# Patient Record
Sex: Female | Born: 1975 | Race: White | Hispanic: No | Marital: Single | Smoking: Never smoker
Health system: Southern US, Community
[De-identification: ages and names within clinical notes are randomized; demographics above are authoritative.]

## PROBLEM LIST (undated history)

## (undated) DIAGNOSIS — F4310 Post-traumatic stress disorder, unspecified: Secondary | ICD-10-CM

## (undated) DIAGNOSIS — F419 Anxiety disorder, unspecified: Secondary | ICD-10-CM

## (undated) DIAGNOSIS — F429 Obsessive-compulsive disorder, unspecified: Secondary | ICD-10-CM

## (undated) DIAGNOSIS — F32A Depression, unspecified: Secondary | ICD-10-CM

## (undated) DIAGNOSIS — F329 Major depressive disorder, single episode, unspecified: Secondary | ICD-10-CM

## (undated) HISTORY — DX: Depression, unspecified: F32.A

## (undated) HISTORY — DX: Post-traumatic stress disorder, unspecified: F43.10

## (undated) HISTORY — DX: Major depressive disorder, single episode, unspecified: F32.9

## (undated) HISTORY — DX: Anxiety disorder, unspecified: F41.9

## (undated) HISTORY — DX: Obsessive-compulsive disorder, unspecified: F42.9

---

## 2013-02-01 DIAGNOSIS — F429 Obsessive-compulsive disorder, unspecified: Secondary | ICD-10-CM | POA: Insufficient documentation

## 2013-02-01 DIAGNOSIS — N390 Urinary tract infection, site not specified: Secondary | ICD-10-CM | POA: Insufficient documentation

## 2013-05-10 DIAGNOSIS — F39 Unspecified mood [affective] disorder: Secondary | ICD-10-CM | POA: Insufficient documentation

## 2013-06-10 HISTORY — PX: CHOLECYSTECTOMY: SHX55

## 2015-11-24 ENCOUNTER — Telehealth: Payer: Self-pay | Admitting: Behavioral Health

## 2015-11-24 NOTE — Telephone Encounter (Signed)
Unable to reach patient at time of Pre-Visit Call.  Left message for patient to return call when available.    

## 2015-11-27 ENCOUNTER — Encounter: Payer: Self-pay | Admitting: Family

## 2015-11-27 ENCOUNTER — Ambulatory Visit (INDEPENDENT_AMBULATORY_CARE_PROVIDER_SITE_OTHER): Payer: BLUE CROSS/BLUE SHIELD | Admitting: Family

## 2015-11-27 VITALS — BP 138/88 | HR 73 | Temp 98.1°F | Resp 16 | Ht 70.5 in | Wt 306.0 lb

## 2015-11-27 DIAGNOSIS — M25572 Pain in left ankle and joints of left foot: Secondary | ICD-10-CM

## 2015-11-27 DIAGNOSIS — F429 Obsessive-compulsive disorder, unspecified: Secondary | ICD-10-CM | POA: Diagnosis not present

## 2015-11-27 DIAGNOSIS — F411 Generalized anxiety disorder: Secondary | ICD-10-CM | POA: Insufficient documentation

## 2015-11-27 DIAGNOSIS — R0683 Snoring: Secondary | ICD-10-CM

## 2015-11-27 DIAGNOSIS — F329 Major depressive disorder, single episode, unspecified: Secondary | ICD-10-CM

## 2015-11-27 DIAGNOSIS — F32A Depression, unspecified: Secondary | ICD-10-CM

## 2015-11-27 MED ORDER — ESCITALOPRAM OXALATE 10 MG PO TABS: ORAL_TABLET | ORAL | Status: DC

## 2015-11-27 NOTE — Assessment & Plan Note (Signed)
Uncontrolled. Should be improved with lexapro.

## 2015-11-27 NOTE — Progress Notes (Signed)
Subjective:    Patient ID: Rhonda Thompson, female    DOB: 09/27/1975, 40 y.o.   MRN: [id number]  HPI  Rhonda Thompson is a 40 yr old female who presents today to establish care.    Pmhx is significant for major depressive disorder, OCD and panic disorder.  She has been prescribed multiple medications in the past.  Review of psych note from [facility] 11/08/13 noted zoloft, xanax and abilify rx.  Dad died in 2002. Has nightmares.  Keeps xanax on hand.  Mom is ill.  Many years ago she was treated with paxil. If she forgot a dose or missed a day she had severe depression symptoms.  Zoloft helped her depression but did not help anxiety.  reports that she is very structured,  Feels like change stresses her out.  Needs to make sure that things are turned off.  Everything needs to face a certain direction.  More stressed at home.  "if I don't have stress I will make it." Lately she is having panic attacks 2-3 times a day. She denies depression.    Elevated blood pressure- reports that she has been on hctz in the past mostly due to LE swelling (sits at work).    She has hx of remote ankle fracture 15 years ago.  Has some chronic pain in the left foot.    Daytime somnolence, witnessed apneas.  Reports some tongue biting, hits head in sleep. Reports that her insurance would not cover sleep study. Can't take time off for EEG, denies witnessed seizures or incontinence.   + spider veins/swelling lower legs, area of spider veins is tender.  Review of Systems    see HPI  Past Medical History  Diagnosis Date  . PTSD (post-traumatic stress disorder)   . OCD (obsessive compulsive disorder)   . Depression   . Anxiety     has panic attacks     Social History   Social History  . Marital Status: Single    Spouse Name: N/A  . Number of Children: N/A  . Years of Education: N/A   Occupational History  . Not on file.   Social History Main Topics  . Smoking status: Never Smoker   . Smokeless tobacco: Not on  file  . Alcohol Use: No  . Drug Use: Not on file  . Sexual Activity: Not on file   Other Topics Concern  . Not on file   Social History Narrative   Lives with her mom   Lost a child 5-6   Raising her niece since she was born (she is age 38)   Works as a Architectural technologistrehab director for [employer]   2 pit bull rescues    Past Surgical History  Procedure Laterality Date  . Cholecystectomy  06/10/13    Family History  Problem Relation Age of Onset  . Hypertension Mother   . Diabetes Mother   . Cancer Mother     ovarian and kidney  . Depression Mother     Allergies  Allergen Reactions  . Caffeine Anxiety and Other (See Comments)  . Iodinated Diagnostic Agents Swelling    Hives and itching  . Other Rash    Bandaids  . Red Dye Other (See Comments)    headache  . Nitrofurantoin Other (See Comments)    headache    No current outpatient prescriptions on file prior to visit.   No current facility-administered medications on file prior to visit.    BP 138/88 mmHg  Pulse 73  Temp(Src) 98.1 F (36.7 C) (Oral)  Resp 16  Ht 5' 10.5" (1.791 m)  Wt 306 lb (138.801 kg)  BMI 43.27 kg/m2  SpO2 100%  LMP 11/09/2015    Objective:   Physical Exam  Constitutional: She is oriented to person, place, and time. She appears well-developed and well-nourished.  Cardiovascular: Normal rate, regular rhythm and normal heart sounds.   No murmur heard. Pulmonary/Chest: Effort normal and breath sounds normal. No respiratory distress. She has no wheezes.  Musculoskeletal:  1-2+ bilateral LE edema (left ankle is slightly more swollen than the right ankle).   Neurological: She is alert and oriented to person, place, and time.  Skin:  + spider veins noted bilateral lower shins L>R  Psychiatric: Her behavior is normal. Judgment and thought content normal.  Briefly tearful during the exam when she discussed her father's death          Assessment & Plan:  L ankle pain- refer to orthopedics  for further evaluation.   Snoring/daytime somnolence, witnessed apneas- suspect OSA. Will send for a home sleep study.  I am concerned about her report of tongue biting at night and head injuries.  We discussed possibility of nighttime seizures. She tells me that her last doctor wanted her to perform EEG overnight but she could not take the time off of work. She is not willing to do at this time but I told her that I think that it would be a good idea as soon as she is able and advised her to let me know and I will arrange when she is ready.  Venous insufficiency- trial of compression stockings, rx provided for the patient.   45 minutes spent with pt today. >50% of this time was spent counseling patient on depression/anxiety, OSA work up and venous insufficiency.

## 2015-11-27 NOTE — Patient Instructions (Signed)
Begin lexapro 10mg , 1/2 tab once daily for 1 week, then increase to a full tablet once daily on week two. You will be contacted about your referral for home sleep test and orthopedics for your ankle pain. Please see if you can get a custom measured support hose.

## 2015-11-27 NOTE — Assessment & Plan Note (Signed)
Uncontrolled.  Will begin lexapro.  I instructed pt to start 1/2 tablet once daily for 1 week and then increase to a full tablet once daily on week two as tolerated. Discussed rare but serious side effect of suicide ideation.  She is instructed to discontinue medication go directly to ED if this occurs.  Pt verbalizes understanding.  Plan follow up in 1 month to evaluate progress.

## 2015-11-27 NOTE — Assessment & Plan Note (Addendum)
Appears stable. Monitor

## 2015-11-27 NOTE — Progress Notes (Signed)
Pre visit review using our clinic review tool, if applicable. No additional management support is needed unless otherwise documented below in the visit note. 

## 2015-12-07 DIAGNOSIS — G4733 Obstructive sleep apnea (adult) (pediatric): Secondary | ICD-10-CM | POA: Diagnosis not present

## 2015-12-13 ENCOUNTER — Other Ambulatory Visit: Payer: Self-pay | Admitting: *Deleted

## 2015-12-13 DIAGNOSIS — R0683 Snoring: Secondary | ICD-10-CM

## 2015-12-13 DIAGNOSIS — G4733 Obstructive sleep apnea (adult) (pediatric): Secondary | ICD-10-CM

## 2015-12-14 ENCOUNTER — Telehealth: Payer: Self-pay | Admitting: Family

## 2015-12-14 DIAGNOSIS — G4730 Sleep apnea, unspecified: Secondary | ICD-10-CM

## 2015-12-14 NOTE — Telephone Encounter (Signed)
Patient returning your call.

## 2015-12-14 NOTE — Telephone Encounter (Signed)
LMOM informing Pt to return call.  

## 2015-12-14 NOTE — Telephone Encounter (Signed)
Please advise pt that I reviewed her home sleep study and it shows severe sleep apnea.  I recommend a sleep study in the lab so we can get her cpap settings optimized. Will arrange.

## 2015-12-15 NOTE — Telephone Encounter (Signed)
Spoke w/ Pt, informed her of home sleep study results and informed her that Melissa recommends being seen by lab for cpap settings. Pt worried about cost. Informed her that our PCC's will contact insurance for approval and when lab calls to schedule they should be able to put her in contact w/ someone w/ there about costs. Pt verbalized understanding.

## 2015-12-18 ENCOUNTER — Telehealth: Payer: Self-pay | Admitting: Family

## 2015-12-18 DIAGNOSIS — G4733 Obstructive sleep apnea (adult) (pediatric): Secondary | ICD-10-CM

## 2015-12-18 NOTE — Telephone Encounter (Signed)
°  Relationship to patient: Self Can be reached: [phone]   Pharmacy:  Reason for call: Patient called stating that she was informed by her insurance company that she has to pay $1500 for the Sleep Study. Patient states she will not be having it done. Also wants to discuss headaches that she is having daily. Plse adv

## 2015-12-18 NOTE — Telephone Encounter (Signed)
Pt returned call. She is requesting a call back. She says if unavailable please leave a detailed voicemail for her.

## 2015-12-18 NOTE — Telephone Encounter (Signed)
[facility] @ [phone]) asking the pt to RTC regarding the message below.//AB/CMA

## 2015-12-19 NOTE — Addendum Note (Signed)
Addended by: Sandford Craze'SULLIVAN, Hazel Wrinkle on: 12/19/2015 04:22 PM   Modules accepted: Orders

## 2015-12-19 NOTE — Telephone Encounter (Signed)
Pt called back and she stated that she has already had the home sleep study, which was on (12/05/15).  She said she has been under a lot of stress at work and with her mother, and the headaches have been getting worse.  She's taking Advil twice a day everyday.  Please advise.//AB/CMA

## 2015-12-19 NOTE — Telephone Encounter (Signed)
Called and left detail VM @ 9:17am @ [phone]) informing the pt-per Melissa that we can submit a home sleep study if she would like to do that.  Also informed her that the headaches could be coming from the sleep apnea.//AB/CMA

## 2015-12-19 NOTE — Telephone Encounter (Signed)
I will send order for auto CPAP. It helps to treat her sleep apnea, however it is not as good as having the setting titrated in a lab. But better than no cpap. Headaches likely related to her untreated sleep apnea.  I would like to see her back in the office 1 month after beginning CPAP.

## 2015-12-20 NOTE — Telephone Encounter (Signed)
Called and spoke with the pt and informed her of the note below.  Pt verbalized understanding and agreed.  Informed her that the order has been placed and she should receive a call from Home Health.  Pt agreed.//AB/CMA

## 2015-12-20 NOTE — Telephone Encounter (Signed)
Did you speak with her? Thanks.

## 2015-12-24 ENCOUNTER — Other Ambulatory Visit: Payer: Self-pay | Admitting: Family

## 2015-12-25 NOTE — Telephone Encounter (Signed)
Spoke with the pt regarding the refill request for the Lexapro.  Pt stated that she has not started the medication yet.  Refill request denied.//AB/CMA

## 2015-12-25 NOTE — Telephone Encounter (Signed)
[facility] @ 11:35am @ [phone]) asking the pt to RTC regarding medication refill request.//AB/CMA

## 2015-12-26 ENCOUNTER — Telehealth: Payer: Self-pay | Admitting: *Deleted

## 2015-12-26 NOTE — Telephone Encounter (Signed)
[facility] from [facility] for order: CPAP w/humidifier, face mask and chinstrap. Order forwarded to PCP for signature.

## 2015-12-27 NOTE — Telephone Encounter (Signed)
Signed and placed in Jessica's Folder.

## 2015-12-28 ENCOUNTER — Ambulatory Visit: Payer: BLUE CROSS/BLUE SHIELD | Admitting: Family

## 2016-01-03 ENCOUNTER — Encounter: Payer: Self-pay | Admitting: Family

## 2016-01-03 ENCOUNTER — Telehealth: Payer: Self-pay | Admitting: *Deleted

## 2016-01-03 NOTE — Telephone Encounter (Signed)
Pt called stating CPAP supplies are too expensive even with insurance approval ($400 +). Her employer offers assistance with the cost if we will send address a letter of medical necessity to [employer]. Pt will print from mychart when letter is ready.  Please advise.

## 2016-11-15 ENCOUNTER — Ambulatory Visit (INDEPENDENT_AMBULATORY_CARE_PROVIDER_SITE_OTHER): Payer: BLUE CROSS/BLUE SHIELD | Admitting: Family

## 2016-11-15 ENCOUNTER — Encounter: Payer: Self-pay | Admitting: Family

## 2016-11-15 VITALS — BP 140/92 | HR 76 | Temp 98.2°F | Resp 20 | Ht 71.0 in | Wt 305.8 lb

## 2016-11-15 DIAGNOSIS — F418 Other specified anxiety disorders: Secondary | ICD-10-CM

## 2016-11-15 DIAGNOSIS — R5383 Other fatigue: Secondary | ICD-10-CM | POA: Diagnosis not present

## 2016-11-15 DIAGNOSIS — G4733 Obstructive sleep apnea (adult) (pediatric): Secondary | ICD-10-CM | POA: Diagnosis not present

## 2016-11-15 LAB — COMPREHENSIVE METABOLIC PANEL
ALT: 13 U/L (ref 0–35)
AST: 15 U/L (ref 0–37)
Albumin: 4.2 g/dL (ref 3.5–5.2)
Alkaline Phosphatase: 45 U/L (ref 39–117)
BUN: 12 mg/dL (ref 6–23)
CHLORIDE: 105 meq/L (ref 96–112)
CO2: 25 meq/L (ref 19–32)
CREATININE: 0.95 mg/dL (ref 0.40–1.20)
Calcium: 9.4 mg/dL (ref 8.4–10.5)
GFR: 69.49 mL/min (ref >60.00)
Glucose, Bld: 92 mg/dL (ref 70–99)
Potassium: 4.5 mEq/L (ref 3.5–5.1)
SODIUM: 137 meq/L (ref 135–145)
Total Bilirubin: 0.5 mg/dL (ref 0.2–1.2)
Total Protein: 7.5 g/dL (ref 6.0–8.3)

## 2016-11-15 LAB — CBC WITH DIFFERENTIAL/PLATELET
BASOS PCT: 0.9 % (ref 0.0–3.0)
Basophils Absolute: 0.1 10*3/uL (ref 0.0–0.1)
Eosinophils Absolute: 0.1 10*3/uL (ref 0.0–0.7)
Eosinophils Relative: 1.4 % (ref 0.0–5.0)
HEMATOCRIT: 39.6 % (ref 36.0–46.0)
Hemoglobin: 12.8 g/dL (ref 12.0–15.0)
LYMPHS PCT: 22.9 % (ref 12.0–46.0)
Lymphs Abs: 1.9 10*3/uL (ref 0.7–4.0)
MCHC: 32.3 g/dL (ref 30.0–36.0)
MCV: 82.3 fl (ref 78.0–100.0)
Monocytes Absolute: 0.6 10*3/uL (ref 0.1–1.0)
Monocytes Relative: 7.5 % (ref 3.0–12.0)
NEUTROS ABS: 5.5 10*3/uL (ref 1.4–7.7)
Neutrophils Relative %: 67.3 % (ref 43.0–77.0)
PLATELETS: 307 10*3/uL (ref 150.0–400.0)
RBC: 4.81 Mil/uL (ref 3.87–5.11)
RDW: 15 % (ref 11.5–15.5)
WBC: 8.2 10*3/uL (ref 4.0–10.5)

## 2016-11-15 LAB — TSH: TSH: 2.39 u[IU]/mL (ref 0.35–4.50)

## 2016-11-15 MED ORDER — ESCITALOPRAM OXALATE 10 MG PO TABS: ORAL_TABLET | ORAL | 0 refills | Status: DC

## 2016-11-15 NOTE — Patient Instructions (Signed)
Complete lab work prior to leaving.  Please establish with a counselor. Begin lexapro 1/2 tab once daily for 1 week, then increase to a full tab once daily on week two. Work on [employer] diet, exercise and weight loss.

## 2016-11-15 NOTE — Progress Notes (Signed)
Subjective:    Patient ID: Rhonda Thompson, female    DOB: 04/12/1976, 41 y.o.   MRN: [id number]  HPI  Rhonda Thompson is a 41 yr old female who presents today to discuss fatigue.    Of note, her mother passed away in September. We noted increased anxiety/depression and OCD symptoms back on 11/27/15 and she was given a trial of lexapro. She did not follow back up with us for re-evaluation.  She states that her life just got crazy with her mother's illness and she never started the medication.   She had a home sleep study performed 12/07/15 which noted severe sleep apnea. We referred her for a split night study which was not completed.  Notes + exhaustion and occasional dizziness. She says that someone called her and told her how expensive the cpap would be and she could not afford it so never followed through.  She has been seeing ENT for some left ear pain.    Review of Systems    see HPI  Past Medical History:  Diagnosis Date  . Anxiety    has panic attacks  . Depression   . OCD (obsessive compulsive disorder)   . PTSD (post-traumatic stress disorder)      Social History   Social History  . Marital status: Single    Spouse name: N/A  . Number of children: N/A  . Years of education: N/A   Occupational History  . Not on file.   Social History Main Topics  . Smoking status: Never Smoker  . Smokeless tobacco: Never Used  . Alcohol use No  . Drug use: Unknown  . Sexual activity: Not on file   Other Topics Concern  . 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 CHOLECYSTECTOMY  06/10/13    Family History  Problem Relation Age of Onset  . Hypertension Mother   . Diabetes Mother   . Cancer Mother        ovarian and kidney  . Depression Mother      Allergies  Allergen Reactions  . Caffeine Anxiety and Other (See Comments)  . Iodinated Diagnostic Agents Swelling    Hives and itching  . Other Rash    Bandaids  . Red Dye Other (See Comments)    headache  . Nitrofurantoin Other (See Comments)    headache    Current Outpatient Prescriptions on File Prior to Visit  Medication Sig Dispense Refill  . ALPRAZolam (XANAX XR) 0.5 MG 24 hr tablet Take 0.5 mg by mouth 4 (four) times daily. Reported on 11/27/2015    . escitalopram (LEXAPRO) 10 MG tablet 1/2 tab once daily for 1 week, then increase to a full tab once daily (Patient not taking: Reported on 11/15/2016) 30 tablet 0   No current facility-administered medications on file prior to visit.     BP (!) 140/92 (BP Location: Left Arm, Cuff Size: Large)   Pulse 76   Temp 98.2 F (36.8 C) (Oral)   Resp 20   Ht 5\' 11"  (1.803 m)   Wt (!) 305 lb 12.8 oz (138.7 kg)   LMP 10/18/2016   SpO2 100%   BMI 42.65 kg/m  Objective:   Physical Exam  Constitutional: She appears well-developed and well-nourished.  Cardiovascular: Normal rate, regular rhythm and normal heart sounds.   No murmur heard. Pulmonary/Chest: Effort normal and breath sounds normal. No respiratory distress. She has no wheezes.  Psychiatric: Her behavior is normal. Judgment and thought content normal.  tearful          Assessment & Plan:  Depression/anxiety- uncontrolled. Denies current SI/HI but reports a feeling of hopelessness. "what is the point." Notes she would never hurt herself because she would never want to put her daughter through that. She agrees to call 911 if she has thoughts of hurting herself or others.  She is agreeable to counseling- I gave her information to call and schedule an appointment with a  Counselor.  OSA- Severe- I think this is one of her primary issues and is impacting her overall wellness and mood. I will try to arrange a trial of autotitration CPAP if she is able to afford. We  did discuss the importance of weight loss.   Fatigue- check TSH, CMET, CBC.  25 minutes spent with pt. >50% of this time was spent counseling patient on anxiety/depression and importance of OSA treatment.

## 2016-11-16 ENCOUNTER — Encounter: Payer: Self-pay | Admitting: Family

## 2016-11-21 ENCOUNTER — Encounter: Payer: Self-pay | Admitting: Family

## 2016-12-16 ENCOUNTER — Ambulatory Visit: Payer: BLUE CROSS/BLUE SHIELD | Admitting: Family

## 2017-03-05 ENCOUNTER — Telehealth: Payer: Self-pay | Admitting: Family

## 2017-03-05 NOTE — Telephone Encounter (Signed)
Relation to WU:JWJX Call back number: [phone]   Reason for call:  Patient dropped of [facility] Physician Result Release Letter, patient states she was advised 03/04/17 by employer and form is due on 03/09/17, patient requesting form please fax to Physician Health Screening form FAX # [phone]   *informed patient paperwork 5 to 7 business day turnaround handed form to [facility]

## 2017-03-05 NOTE — Telephone Encounter (Signed)
Patient advised physical is needed to complete form due to labs being needed as per Jasmine December. Patient informed and states please discard she will go to a facility were labs will be conducted and immediate release of results.

## 2017-05-23 DIAGNOSIS — H81399 Other peripheral vertigo, unspecified ear: Secondary | ICD-10-CM | POA: Diagnosis not present

## 2017-05-23 DIAGNOSIS — H539 Unspecified visual disturbance: Secondary | ICD-10-CM | POA: Diagnosis not present

## 2017-05-23 DIAGNOSIS — R42 Dizziness and giddiness: Secondary | ICD-10-CM | POA: Diagnosis not present

## 2017-05-23 DIAGNOSIS — Z881 Allergy status to other antibiotic agents status: Secondary | ICD-10-CM | POA: Diagnosis not present

## 2017-05-23 DIAGNOSIS — H81393 Other peripheral vertigo, bilateral: Secondary | ICD-10-CM | POA: Diagnosis not present

## 2017-06-20 ENCOUNTER — Ambulatory Visit: Payer: BLUE CROSS/BLUE SHIELD | Admitting: Family

## 2017-06-20 VITALS — BP 144/94 | HR 68 | Temp 98.6°F | Resp 16 | Ht 71.0 in | Wt 306.0 lb

## 2017-06-20 DIAGNOSIS — R002 Palpitations: Secondary | ICD-10-CM | POA: Diagnosis not present

## 2017-06-20 DIAGNOSIS — R42 Dizziness and giddiness: Secondary | ICD-10-CM

## 2017-06-20 DIAGNOSIS — F418 Other specified anxiety disorders: Secondary | ICD-10-CM | POA: Diagnosis not present

## 2017-06-20 DIAGNOSIS — G4733 Obstructive sleep apnea (adult) (pediatric): Secondary | ICD-10-CM

## 2017-06-20 LAB — BASIC METABOLIC PANEL
BUN: 10 mg/dL (ref 6–23)
CALCIUM: 9.4 mg/dL (ref 8.4–10.5)
CO2: 28 meq/L (ref 19–32)
Chloride: 103 mEq/L (ref 96–112)
Creatinine, Ser: 0.87 mg/dL (ref 0.40–1.20)
GFR: 76.68 mL/min (ref >60.00)
GLUCOSE: 93 mg/dL (ref 70–99)
POTASSIUM: 4.5 meq/L (ref 3.5–5.1)
SODIUM: 139 meq/L (ref 135–145)

## 2017-06-20 LAB — CBC WITH DIFFERENTIAL/PLATELET
BASOS ABS: 0.1 10*3/uL (ref 0.0–0.1)
Basophils Relative: 0.9 % (ref 0.0–3.0)
EOS PCT: 2.6 % (ref 0.0–5.0)
Eosinophils Absolute: 0.2 10*3/uL (ref 0.0–0.7)
HEMATOCRIT: 38.5 % (ref 36.0–46.0)
Hemoglobin: 12.5 g/dL (ref 12.0–15.0)
LYMPHS ABS: 1.7 10*3/uL (ref 0.7–4.0)
LYMPHS PCT: 24 % (ref 12.0–46.0)
MCHC: 32.4 g/dL (ref 30.0–36.0)
MCV: 82.6 fl (ref 78.0–100.0)
MONOS PCT: 6.8 % (ref 3.0–12.0)
Monocytes Absolute: 0.5 10*3/uL (ref 0.1–1.0)
NEUTROS PCT: 65.7 % (ref 43.0–77.0)
Neutro Abs: 4.6 10*3/uL (ref 1.4–7.7)
Platelets: 281 10*3/uL (ref 150.0–400.0)
RBC: 4.66 Mil/uL (ref 3.87–5.11)
RDW: 15.3 % (ref 11.5–15.5)
WBC: 7 10*3/uL (ref 4.0–10.5)

## 2017-06-20 LAB — TSH: TSH: 2.92 u[IU]/mL (ref 0.35–4.50)

## 2017-06-20 NOTE — Progress Notes (Signed)
Subjective:    Patient ID: Rhonda Thompson, female    DOB: 09/18/1975, 42 y.o.   MRN: [id number]  HPI  Pt is a 42 yr old female who presents today for follow up.  1) Patient is a 42 yr old female who presents today with report of intermittent dizziness. Reports that dizziness has been present since last year.   ED note reviewed from 12/14 in care everywhere.  She was diagnosed with peripheral vertigo and was prescribed valium, meclizine, prednisone. She reports that she did not take the medication due to concern about side effects.    2) Palpitations- have been present x 2 weeks.  Reports that palpitations are worse at night and in the AM.    3) Depression/anxiety- last visit symptoms were uncontrolled and we recommended counseling. We aslso recommended a trial of lexapro back in June but have not seen her back until now. Reports that she is seeing psychiatry through her work.   They stopped lexapro and put her on zoloft and an extended release xanax.  Reports that she has a lot of tension in neck/shoulders.   Wt Readings from Last 3 Encounters:  06/20/17 (!) 306 lb (138.8 kg)  11/15/16 (!) 305 lb 12.8 oz (138.7 kg)  11/27/15 (!) 306 lb (138.8 kg)   4) OSA- had a home sleep study 6/17 which noted severe osa.  Reports that she wakes up a "million times" and has bad nightmare. Reports that her insurance will not cover any of the cpap.  Out of    Review of Systems    see HPI  Past Medical History:  Diagnosis Date  . Anxiety    has panic attacks  . Depression   . OCD (obsessive compulsive disorder)   . PTSD (post-traumatic stress disorder)      Social History   Socioeconomic History  . Marital status: Single    Spouse name: Not on file  . Number of children: Not on file  . Years of education: Not on file  . Highest education level: Not on file  Social Needs  . Financial resource strain: Not on file  . Food insecurity - worry: Not on file  . Food insecurity - inability: Not on  file  . Transportation needs - medical: Not on file  . Transportation needs - non-medical: Not on file  Occupational History  . Not on file  Tobacco Use  . Smoking status: Never Smoker  . Smokeless tobacco: Never Used  Substance and Sexual Activity  . Alcohol use: No    Alcohol/week: 0.0 oz  . Drug use: Not on file  . Sexual activity: Not on file  Other Topics Concern  . 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 CHOLECYSTECTOMY  06/10/13    Family History  Problem Relation Age of Onset  . Hypertension Mother   . Diabetes Mother   . Cancer Mother        ovarian and kidney  . Depression Mother     Allergies  Allergen Reactions  . Caffeine Anxiety and Other (See Comments)  . Iodinated Diagnostic Agents Swelling    Hives and itching  . Other Rash    Bandaids  .  Red Dye Other (See Comments)    headache  . Nitrofurantoin Other (See Comments)    headache    Current Outpatient Medications on File Prior to Visit  Medication Sig Dispense Refill  . sertraline (ZOLOFT) 50 MG tablet Take 1 tablet by mouth daily.    Marland Kitchen. ALPRAZolam (XANAX XR) 0.5 MG 24 hr tablet Take 1 tablet by mouth daily as needed.     No current facility-administered medications on file prior to visit.     BP (!) 136/98 (BP Location: Right Arm) Comment (Cuff Size): thigh cuff  Pulse 68   Temp 98.6 F (37 C) (Oral)   Resp 16   Ht 5\' 11"  (1.803 m)   Wt (!) 306 lb (138.8 kg)   SpO2 100%   BMI 42.68 kg/m    Objective:   Physical Exam  Constitutional: She is oriented to person, place, and time. She appears well-developed and well-nourished.  Cardiovascular: Normal rate, regular rhythm and normal heart sounds.  No murmur heard. Pulmonary/Chest: Effort normal and breath sounds normal. No  respiratory distress. She has no wheezes.  Neurological: She is alert and oriented to person, place, and time.  Psychiatric: Her behavior is normal. Judgment and thought content normal.  tearful          Assessment & Plan:  BPV- refer for vestibular rehab. BP Readings from Last 3 Encounters:  06/20/17 (!) 136/98  11/15/16 (!) 140/92  11/27/15 138/88   OSA- severe/untreated- I think this is her primary medical issue and contributing to her other symptoms. Insurance will not cover cpap.  Will refer her to orthodontics for evaluation for dental appliance. Discussed healthy diet, exercise and weight loss.  Anxiety/depression- now following with psychiatry- this remains uncontrolled. Advised her to follow up with psychiatry.  Palpitations- likely related to her anxiety- check bmet, cbc, tsh- EKG today.  EKG tracing is personally reviewed.  EKG notes NSR.  No acute changes.

## 2017-06-20 NOTE — Progress Notes (Signed)
Repeat BP 144/94. Pt's machine was consistent with our reading in the office. Other readings from pt's machine have been well within the normal range. Pt feels like she gets anxious when she is at the doctor. Per verbal from PCP, pt has been advised to check BP over the next week and send us the readings. Pt voices understanding.

## 2017-06-20 NOTE — Addendum Note (Signed)
Addended by: Mervin KungFERGERSON, Kayline Sheer A on: 06/20/2017 01:57 PM   Modules accepted: Orders

## 2017-06-20 NOTE — Patient Instructions (Addendum)
Please complete lab work prior to leaving.   You will be contacted about your referral to Dr. Myrtis SerKatz (orthodontics) for dental appliance consideration. You will also be contacted about your referral to vestibular rehab for you vertigo. Please follow up with psychiatry for ongoing treatment of your anxiety. Continue to work on health diet exercise and weight loss.

## 2017-06-25 DIAGNOSIS — F330 Major depressive disorder, recurrent, mild: Secondary | ICD-10-CM | POA: Diagnosis not present

## 2017-06-27 ENCOUNTER — Telehealth: Payer: Self-pay | Admitting: *Deleted

## 2017-06-27 NOTE — Telephone Encounter (Signed)
Received Physician Orders from Sylvan CheeseMark J. Katz, DDS; forwarded to provider/SLS 01/18

## 2017-07-04 ENCOUNTER — Ambulatory Visit: Payer: BLUE CROSS/BLUE SHIELD | Admitting: Rehabilitative and Restorative Service Providers"

## 2017-07-07 ENCOUNTER — Telehealth: Payer: Self-pay | Admitting: *Deleted

## 2017-07-07 NOTE — Telephone Encounter (Signed)
Copied from CRM [phone]. Topic: Inquiry >> Jul 07, 2017 11:03 AM Yvonna Alanisobinson, Andra M wrote: Reason for CRM: Patient called stating that the company that Sandford CrazeMelissa O'Sullivan referred the patient to, is requiring a copy of the patient's sleep study from 2 years ago. Patient wants Efraim KaufmannMelissa to call [phone].        Thank You!!!

## 2017-07-08 NOTE — Telephone Encounter (Signed)
Left message for pt to check mychart acct. Message sent. 

## 2017-07-09 ENCOUNTER — Ambulatory Visit: Payer: BLUE CROSS/BLUE SHIELD | Admitting: Physical Therapy

## 2017-07-14 ENCOUNTER — Encounter: Payer: Self-pay | Admitting: Physical Therapy

## 2017-07-14 ENCOUNTER — Ambulatory Visit: Payer: BLUE CROSS/BLUE SHIELD | Attending: Family | Admitting: Physical Therapy

## 2017-07-14 DIAGNOSIS — R252 Cramp and spasm: Secondary | ICD-10-CM | POA: Diagnosis not present

## 2017-07-14 DIAGNOSIS — R2681 Unsteadiness on feet: Secondary | ICD-10-CM | POA: Diagnosis not present

## 2017-07-14 DIAGNOSIS — R42 Dizziness and giddiness: Secondary | ICD-10-CM

## 2017-07-14 NOTE — Patient Instructions (Signed)
Feet Together (Compliant Surface) Head Motion - Eyes Closed    Stand on compliant surface: _pillow or cushion__ with feet together. Close eyes and move head slowly, up and down 10 times each way and side to side 10 times each way. Repeat __1-2__ times per session. Do __2-3__ sessions per day.  Gaze Stabilization: Standing Feet Apart    Feet shoulder width apart, keeping eyes on target on wall __3-5__ feet away, move head side to side for __30__ seconds. Repeat while moving head up and down for __30__ seconds. Do _2-3___ sessions per day.   Gaze Stabilization: Tip Card  1.Target must remain in focus, not blurry, and appear stationary while head is in motion. 2.Perform exercises with small head movements (45 to either side of midline). 3.Increase speed of head motion so long as target is in focus. 4.If you wear eyeglasses, be sure you can see target through lens (therapist will give specific instructions for bifocal / progressive lenses). 5.These exercises may provoke dizziness or nausea. Work through these symptoms. If too dizzy, slow head movement slightly. Rest between each exercise. 6.Exercises demand concentration; avoid distractions. 7.For safety, perform standing exercises close to a counter, wall, corner, or next to someone.  Copyright  VHI. All rights reserved.

## 2017-07-14 NOTE — Therapy (Addendum)
[facility] [address] [place], [place], [address] Phone: [phone]   Fax:  [phone]  Physical Therapy Evaluation/Discharge  Patient Details  Name: Rhonda Thompson MRN: [id number] Date of Birth: 03-Feb-1976 Referring Provider: Debbrah Alar, NP   Encounter Date: 07/14/2017  PT End of Session - 07/14/17 0843    Visit Number  1    Number of Visits  6    Date for PT Re-Evaluation  08/25/17    Authorization Type  BC[employer]    PT Start Time  0800    PT Stop Time  0841    PT Time Calculation (min)  41 min    Activity Tolerance  Patient tolerated treatment well    Behavior During Therapy  [facility] for tasks assessed/performed       Past Medical History:  Diagnosis Date  . Anxiety    has panic attacks  . Depression   . OCD (obsessive compulsive disorder)   . PTSD (post-traumatic stress disorder)     Past Surgical History:  Procedure Laterality Date  . CHOLECYSTECTOMY  06/10/13    There were no vitals filed for this visit.   Subjective Assessment - 07/14/17 0803    Subjective  Pt is a 42 y/o female who presents to OPPT with ~ 1 year hx of intermittent vertigo.  Pt reports approx 1 year ago had episode of intense vertigo needing about 3 weeks to recover.  Pt expresses Lt ear ringing and popping.  Pt reports increased anxiety due to fear of vertigo returning.      Patient Stated Goals  improve dizziness    Currently in Pain?  No/denies         [facility] PT Assessment - 07/14/17 6948      Assessment   Medical Diagnosis  vertigo    Referring Provider  Debbrah Alar, NP    Onset Date/Surgical Date  -- 1 year ago    Next MD Visit  09/19/17    Prior Therapy  none      Precautions   Precautions  None      Restrictions   Weight Bearing Restrictions  No      Balance Screen   Has the patient fallen in the past 6 months  No    Has the patient had a decrease in activity level because of a fear of falling?   No     Is the patient reluctant to leave their home because of a fear of falling?   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    Leisure  daily exercise: walking daily and water aerobics nightly      Cognition   Overall Cognitive Status  Within Functional Limits for tasks assessed      Functional Tests   Functional tests  Other      Other:   Other/ Comments  balance: EC on foam  increased postural sway with feet apart and together; able to maintain balance at least 10 sec      Posture/Postural Control   Posture/Postural Control  Postural limitations    Postural Limitations  Rounded Shoulders;Forward head;Increased thoracic kyphosis      Palpation   Palpation comment  trigger points noted in suboccipital muscles and upper trap, levator      Ambulation/Gait   Gait Comments  amb independently without significant deviations         Vestibular Assessment - 07/14/17 0810      Vestibular Assessment   General Observation  no symptoms at rest      Symptom Behavior   Type of Dizziness  Lightheadedness pt keeps going when she stops    Frequency of Dizziness  weekly    Duration of Dizziness  seconds    Aggravating Factors  Moving eyes;Turning head quickly;Turning head sideways poor sleeper, tension    Relieving Factors  -- neck stretches      Occulomotor Exam   Occulomotor Alignment  Normal    Spontaneous  Absent    Gaze-induced  Absent    Smooth Pursuits  Intact    Saccades  Intact      Vestibulo-Occular Reflex   VOR 1 Head Only (x 1 viewing)  WNL with  mild increase in symptoms upon stopping    Comment  HIT slightly positive to Lt      Positional Testing   Sidelying Test  Sidelying Right;Sidelying Left    Horizontal Canal Testing  Horizontal Canal Right;Horizontal Canal Left      Sidelying Right   Sidelying Right Duration  none    Sidelying Right Symptoms  No nystagmus      Sidelying Left   Sidelying Left Duration  none    Sidelying Left Symptoms  No nystagmus      Horizontal Canal Right   Horizontal Canal Right Duration  none    Horizontal Canal Right Symptoms  Normal      Horizontal Canal Left   Horizontal Canal Left Duration  none    Horizontal Canal Left Symptoms  Normal         Objective measurements completed on examination: See above findings.       Vestibular Treatment/Exercise - 07/14/17 0001      Vestibular Treatment/Exercise   Vestibular Treatment Provided  Gaze    Gaze Exercises  X1 Viewing Horizontal;X1 Viewing Vertical      X1 Viewing Horizontal   Foot Position  standing    Time  -- 30 sec    Reps  1    Comments  mild increase in symptoms      X1 Viewing Vertical   Foot Position  standing    Time  -- 30 sec    Reps  1    Comments  no symptoms         Balance Exercises - 07/14/17 0842      Balance Exercises: Standing   Standing Eyes Closed  Narrow base of support (BOS);Head turns;Foam/compliant surface        PT Education - 07/14/17 1114    Education provided  Yes    Education Details  HEP, clinical findings, POC, goals of care    Person(s) Educated  Patient    Methods  Explanation;Demonstration;Handout    Comprehension  Verbalized understanding;Returned demonstration;Need further instruction          PT Long Term Goals - 07/14/17 1119      PT LONG  TERM GOAL #1   Title  independent with HEP    Status  New    Target Date  08/25/17      PT LONG TERM GOAL #2   Title  verbalize understanding of posture/workstation set up to decrease risk of reinjury    Status  New     Target Date  08/25/17      PT LONG TERM GOAL #3   Title  report 50% improvement in vertigo symptoms for improved function and mobility    Status  New    Target Date  08/25/17             Plan - 07/14/17 1114    Clinical Impression Statement  Pt is a 42 y/o female who presents to OPPT for ~ 1 year onset of dizziness.  Pt's subjective history suggestive of acute vestibular neuritis ~ 1 year ago with initial symptoms.  Since then pt reports increased imbalance and mild symptoms with eye movements, indicating that pt hasn't fully compensated at this time.  This is complicated by anxiety and increased cervical tightness due to work set up and anxiety.  Pt will benefit from PT to address deficits listed.    History and Personal Factors relevant to plan of care:  anxiety, depression, OCD, PTSD    Clinical Presentation  Evolving    Clinical Presentation due to:  multifactorial symptoms    Clinical Decision Making  Moderate    Rehab Potential  Good    PT Frequency  1x / week    PT Duration  6 weeks    PT Treatment/Interventions  ADLs/Self Care Home Management;Canalith Repostioning;Cryotherapy;Electrical Stimulation;Moist Heat;Therapeutic exercise;Therapeutic activities;Functional mobility training;Gait training;Balance training;Neuromuscular re-education;Patient/family education;Manual techniques;Taping;Dry needling;Passive range of motion    PT Next Visit Plan  review HEP and progress PRN, consider manual/DN to neck and suboccipitals    Consulted and Agree with Plan of Care  Patient       Patient will benefit from skilled therapeutic intervention in order to improve the following deficits and impairments:  Increased fascial restricitons, Increased muscle spasms, Dizziness, Postural dysfunction  Visit Diagnosis: Dizziness and giddiness - Plan: PT plan of care cert/re-cert  Cramp and spasm - Plan: PT plan of care cert/re-cert  Unsteadiness on feet - Plan: PT plan of care  cert/re-cert     Problem List Patient Active Problem List   Diagnosis Date Noted  . Anxiety state 11/27/2015  . OCD (obsessive compulsive disorder) 11/27/2015  . Depression 11/27/2015      Laureen Abrahams, PT, DPT 07/14/17 11:32 AM    [facility] [address] [place], [place], [address] Phone: [phone]   Fax:  [phone]  Name: Rhonda Thompson MRN: [id number] Date of Birth: 1976/05/11      PHYSICAL THERAPY DISCHARGE SUMMARY  Visits from Start of Care: 1  Current functional level related to goals / functional outcomes: See above   Remaining deficits: Unknown; pt didn't return   Education / Equipment: n/a  Plan: Patient agrees to discharge.  Patient goals were not met. Patient is being discharged due to not returning since the last visit.  ?????    Laureen Abrahams, PT, DPT 08/15/17 11:59 AM  [facility] [address] [place], [place] [address]  [phone] (office) [phone] (fax)

## 2017-07-23 ENCOUNTER — Ambulatory Visit: Payer: BLUE CROSS/BLUE SHIELD | Admitting: Physical Therapy

## 2017-07-23 DIAGNOSIS — F429 Obsessive-compulsive disorder, unspecified: Secondary | ICD-10-CM | POA: Diagnosis not present

## 2017-07-23 DIAGNOSIS — F331 Major depressive disorder, recurrent, moderate: Secondary | ICD-10-CM | POA: Diagnosis not present

## 2017-07-23 DIAGNOSIS — F411 Generalized anxiety disorder: Secondary | ICD-10-CM | POA: Diagnosis not present

## 2017-09-19 ENCOUNTER — Ambulatory Visit: Payer: BLUE CROSS/BLUE SHIELD | Admitting: Family

## 2017-10-21 DIAGNOSIS — M7662 Achilles tendinitis, left leg: Secondary | ICD-10-CM | POA: Diagnosis not present

## 2017-10-21 DIAGNOSIS — M7752 Other enthesopathy of left foot: Secondary | ICD-10-CM | POA: Diagnosis not present

## 2017-10-21 DIAGNOSIS — M9262 Juvenile osteochondrosis of tarsus, left ankle: Secondary | ICD-10-CM | POA: Diagnosis not present

## 2018-04-10 DIAGNOSIS — M9262 Juvenile osteochondrosis of tarsus, left ankle: Secondary | ICD-10-CM | POA: Diagnosis not present

## 2018-04-10 DIAGNOSIS — M7752 Other enthesopathy of left foot: Secondary | ICD-10-CM | POA: Diagnosis not present

## 2018-04-10 DIAGNOSIS — M7662 Achilles tendinitis, left leg: Secondary | ICD-10-CM | POA: Diagnosis not present

## 2018-09-04 ENCOUNTER — Telehealth: Payer: BLUE CROSS/BLUE SHIELD | Admitting: Nurse Practitioner

## 2018-09-04 DIAGNOSIS — N3000 Acute cystitis without hematuria: Secondary | ICD-10-CM | POA: Diagnosis not present

## 2018-09-04 MED ORDER — CIPROFLOXACIN HCL 500 MG PO TABS: 500.0000 mg | ORAL_TABLET | Freq: Two times a day (BID) | ORAL | 0 refills | Status: DC

## 2018-09-04 NOTE — Progress Notes (Signed)
We are sorry that you are not feeling well.  Here is how we plan to help!  Based on what you shared with me it looks like you most likely have a simple urinary tract infection.  A UTI (Urinary Tract Infection) is a bacterial infection of the bladder.  Most cases of urinary tract infections are simple to treat but a key part of your care is to encourage you to drink plenty of fluids and watch your symptoms carefully.  I have prescribed  cipro 500 bid for 5 dsays.  Your symptoms should gradually improve. Call us if the burning in your urine worsens, you develop worsening fever, back pain or pelvic pain or if your symptoms do not resolve after completing the antibiotic.  Urinary tract infections can be prevented by drinking plenty of water to keep your body hydrated.  Also be sure when you wipe, wipe from front to back and don't hold it in!  If possible, empty your bladder every 4 hours.  Your e-visit answers were reviewed by a board certified advanced clinical practitioner to complete your personal care plan.  Depending on the condition, your plan could have included both over the counter or prescription medications.  If there is a problem please reply  once you have received a response from your provider.  Your safety is important to [place].  If you have drug allergies check your prescription carefully.    You can use MyChart to ask questions about today's visit, request a non-urgent call back, or ask for a work or school excuse for 24 hours related to this e-Visit. If it has been greater than 24 hours you will need to follow up with your provider, or enter a new e-Visit to address those concerns.   You will get an e-mail in the next two days asking about your experience.  I hope that your e-visit has been valuable and will speed your recovery. Thank you for using e-visits. 5 minutes spent reviewing and documenting in chart.

## 2018-09-22 DIAGNOSIS — F331 Major depressive disorder, recurrent, moderate: Secondary | ICD-10-CM | POA: Diagnosis not present

## 2018-09-22 DIAGNOSIS — F411 Generalized anxiety disorder: Secondary | ICD-10-CM | POA: Diagnosis not present

## 2018-11-10 ENCOUNTER — Telehealth: Payer: BLUE CROSS/BLUE SHIELD | Admitting: Physician Assistant

## 2018-11-10 DIAGNOSIS — S30861A Insect bite (nonvenomous) of abdominal wall, initial encounter: Secondary | ICD-10-CM

## 2018-11-10 DIAGNOSIS — W57XXXA Bitten or stung by nonvenomous insect and other nonvenomous arthropods, initial encounter: Secondary | ICD-10-CM | POA: Diagnosis not present

## 2018-11-10 DIAGNOSIS — L282 Other prurigo: Secondary | ICD-10-CM

## 2018-11-10 MED ORDER — DOXYCYCLINE HYCLATE 100 MG PO CAPS: 100.0000 mg | ORAL_CAPSULE | Freq: Two times a day (BID) | ORAL | 0 refills | Status: AC

## 2018-11-10 NOTE — Progress Notes (Signed)
Thank you for describing your tick bite, Here is how we plan to help! Based on the information that you shared with me it looks like you have An infected or complicated tick bite that requires a longer course of antibiotics and will need for you to schedule a follow-up visit with a provider.  In most cases a tick bite is painless and does not itch.  Most tick bites in which the tick is quickly removed do not require prescriptions. Ticks can transmit several diseases if they are infected and remain attacked to your skin. Therefore the length that the tick was attached and any symptoms you have experienced after the bite are import to accurately develop your custom treatment plan. In most cases a single dose of doxycycline may prevent the development of a more serious condition.  Based on your information I have Provided a home care guide for tick bites  and  instructions on when to call for help. and Your symptoms indicate that you need a longer course of antibiotics and a follow up visit with a provider. I have sent doxycycline 100 mg twice a day for 14 days to the pharmacy that you selected. You will need to schedule a follow up visit with your provider. If you do not have a primary care provider you may use our telehealth physicians on the web at [facility].  Which ticks  are associated with illness?  The Wood Tick (dog tick) is the size of a watermelon seed and can sometimes transmit [facility] spotted fever and [place] tick fever.   The Deer Tick (black-legged tick) is between the size of a poppy seed (pin head) and an apple seed, and can sometimes transmit Lyme disease.  A brown to black tick with a white splotch on its back is likely a female Amblyomma americanum ([place] tick). This tick has been associated with Southern Tick Associated illness ( STARI)  Lyme disease has become the most common tick-borne illness in the [place]. The risk of Lyme disease following a  recognized deer tick bite is estimated to be 1%.  The majority of cases of Lyme disease start with a bull's eye rash at the site of the tick bite. The rash can occur days to weeks (typically 7-10 days) after a tick bite. Treatment with antibiotics is indicated if this rash appears. Flu-like symptoms may accompany the rash, including: fever, chills, headaches, muscle aches, and fatigue. Removing ticks promptly may prevent tick borne disease.  What can be used to prevent Tick Bites?  Insect repellant with at leas 20% DEET. Wearing long pants with sock and shoes. Avoiding tall grass and heavily wooded areas. Checking your skin after being outdoors. Shower with a washcloth after outdoor exposures.  HOME CARE ADVICE FOR TICK BITE  Wood Tick Removal:  Use a pair of tweezers and grasp the wood tick close to the skin (on its head). Pull the wood tick straight upward without twisting or crushing it. Maintain a steady pressure until it releases its grip.  If tweezers aren't available, use fingers, a loop of thread around the jaws, or a needle between the jaws for traction.  Note: covering the tick with petroleum jelly, nail polish or rubbing alcohol doesn't work. Neither does touching the tick with a hot or cold object. Tiny Deer Tick Removal:  Needs to be scraped off with a knife blade or credit card edge. Place tick in a sealed container (e.g. glass jar, zip lock plastic bag), in case  your doctor wants to see it. Tick's Head Removal:  If the wood tick's head breaks off in the skin, it must be removed. Clean the skin. Then use a sterile needle to uncover the head and lift it out or scrape it off.  If a very small piece of the head remains, the skin will eventually slough it off. Antibiotic Ointment:  Wash the wound and your hands with soap and water after removal to prevent catching any tick disease. Apply an over the counter antibiotic ointment (e.g. bacitracin) to the bite once. Expected  Course: Tick bites normally don't itch or hurt. That's why they often go unnoticed. Call Your Doctor If:  You can't remove the tick or the tick's head Fever, a severe head ache, or rash occur in the next 2 weeks Bite begins to look infected Lyme's disease is common in your area You have not had a tetanus in the last 10 years Your current symptoms become worse    MAKE SURE YOU  Understand these instructions. Will watch your condition. Will get help right away if you are not doing well or get worse.   Thank you for choosing an e-visit.  Your e-visit answers were reviewed by a board certified advanced clinical practitioner to complete your personal care plan. Depending upon the condition, your plan could have included both over the counter or prescription medications. Please review your pharmacy choice. If there is a problem you may use MyChart messaging to have the prescription routed to another pharmacy. Your safety is important to [place]. If you have drug allergies check your prescription carefully.   You can use MyChart to ask questions about today's visit, request a non-urgent call back, or ask for a work or school excuse for 24 hours related to this e-Visit. If it has been greater than 24 hours you will need to follow up with your provider, or enter a new e-Visit to address those concerns.  You will get an email in the next two days asking about your experience. I hope  that your e-visit has been valuable and will speed your recovery  ===View-only below this line===   ----- Message -----    From: Rosalee Kaufman    Sent: 11/10/2018  3:33 PM EDT      To: E-Visit Mailing List Subject: E-Visit Submission: Tick Bite  E-Visit Submission: Tick Bite --------------------------------  Question: Is the tick still attached? Answer:   No  Question: When were you bitten? Answer:   11/07/2018  Question: Where is the tick bite located? Answer:   Left hip area  Question: Was the tick  attached for greater than 48 hours? Answer:   No  Question: A tick is a small brown bug that bites into the skin. Which of the following best describes the tick that bit you? Answer:   None of these describes the tick that bit me.  Question: Are you experiencing any of the following symptoms? Loraine Leriche all that apply) Answer:   A red ring or bulls eye rash that occurs around the bite  Question: Does the site of the bite look infected (spreading redness or puss that started a day or two after the bite)? Answer:   No  Question: Have you had a tetanus booster in the last 10 years? Answer:   Yes  Question: Are you allergic to or intolerant of Doxycycline? Answer:   No  Question: Could you attach a picture of the bite? Answer:   Yes  Question: Are you  pregnant? Answer:   I am confident that I am not pregnant  Question: Are you breastfeeding? Answer:   No  Question: Have you ever had a tick borne illness in the past? Answer:   No  Question: Are there any other concerns related to the tick bite that you would like to share with your provider? Answer:   Itches, I've been nauseous for the last day and a half and I feel just... blah  Question: Please list your medication allergies that you may have ? (If 'none' , please list as 'none') Answer:   Macrobid, red dye  Question: Please list any additional comments  Answer:

## 2019-01-20 DIAGNOSIS — Z1159 Encounter for screening for other viral diseases: Secondary | ICD-10-CM | POA: Diagnosis not present

## 2019-02-05 DIAGNOSIS — Z1159 Encounter for screening for other viral diseases: Secondary | ICD-10-CM | POA: Diagnosis not present

## 2019-02-09 ENCOUNTER — Encounter: Payer: Self-pay | Admitting: Family

## 2019-02-22 DIAGNOSIS — Z1159 Encounter for screening for other viral diseases: Secondary | ICD-10-CM | POA: Diagnosis not present

## 2019-03-01 DIAGNOSIS — Z1159 Encounter for screening for other viral diseases: Secondary | ICD-10-CM | POA: Diagnosis not present

## 2019-03-02 ENCOUNTER — Encounter: Payer: Self-pay | Admitting: Family

## 2019-03-03 ENCOUNTER — Telehealth: Payer: Self-pay | Admitting: Family

## 2019-03-03 NOTE — Telephone Encounter (Signed)
See my chart message

## 2019-03-15 DIAGNOSIS — Z1159 Encounter for screening for other viral diseases: Secondary | ICD-10-CM | POA: Diagnosis not present

## 2019-03-22 ENCOUNTER — Telehealth: Payer: BLUE CROSS/BLUE SHIELD | Admitting: Physician Assistant

## 2019-03-22 DIAGNOSIS — R399 Unspecified symptoms and signs involving the genitourinary system: Secondary | ICD-10-CM | POA: Diagnosis not present

## 2019-03-22 MED ORDER — CIPROFLOXACIN HCL 500 MG PO TABS: 500.0000 mg | ORAL_TABLET | Freq: Two times a day (BID) | ORAL | 0 refills | Status: DC

## 2019-03-22 NOTE — Progress Notes (Signed)
We are sorry that you are not feeling well.  Here is how we plan to help!  Based on what you shared with me it looks like you most likely have a simple urinary tract infection.  A UTI (Urinary Tract Infection) is a bacterial infection of the bladder.  Most cases of urinary tract infections are simple to treat but a key part of your care is to encourage you to drink plenty of fluids and watch your symptoms carefully.  Stop taking Doxycycline.  I have prescribed ciprofloxacin 500 MG, take 1 tab twice daily x 5 days.  Your symptoms should gradually improve. Call us if the burning in your urine worsens, you develop worsening fever, back pain or pelvic pain or if your symptoms do not resolve after completing the antibiotic.  Urinary tract infections can be prevented by drinking plenty of water to keep your body hydrated.  Also be sure when you wipe, wipe from front to back and don't hold it in!  If possible, empty your bladder every 4 hours.  Your e-visit answers were reviewed by a board certified advanced clinical practitioner to complete your personal care plan.  Depending on the condition, your plan could have included both over the counter or prescription medications.  If there is a problem please reply  once you have received a response from your provider.  Your safety is important to [place].  If you have drug allergies check your prescription carefully.    You can use MyChart to ask questions about today's visit, request a non-urgent call back, or ask for a work or school excuse for 24 hours related to this e-Visit. If it has been greater than 24 hours you will need to follow up with your provider, or enter a new e-Visit to address those concerns.   You will get an e-mail in the next two days asking about your experience.  I hope that your e-visit has been valuable and will speed your recovery. Thank you for using e-visits.   Greater than 5 minutes, yet less than 10 minutes of time have been  spent researching, coordinating and implementing care for this patient today.

## 2019-03-23 DIAGNOSIS — Z1159 Encounter for screening for other viral diseases: Secondary | ICD-10-CM | POA: Diagnosis not present

## 2019-03-29 DIAGNOSIS — Z1159 Encounter for screening for other viral diseases: Secondary | ICD-10-CM | POA: Diagnosis not present

## 2019-03-31 ENCOUNTER — Telehealth: Payer: Self-pay | Admitting: Nurse Practitioner

## 2019-03-31 DIAGNOSIS — R399 Unspecified symptoms and signs involving the genitourinary system: Secondary | ICD-10-CM

## 2019-03-31 NOTE — Progress Notes (Signed)
Based on what you shared with me it looks like you have an unresolved UTI with back pain and abdominal pain.,that should be evaluated in a face to face office visit. You need a urinalysis and urine culture for proper treatment.    NOTE: If you entered your credit card information for this eVisit, you will not be charged. You may see a "hold" on your card for the $35 but that hold will drop off and you will not have a charge processed.  If you are having a true medical emergency please call 911.     For an urgent face to face visit, [place] has four urgent care centers for your convenience:   . [facility]    [phone]                  Get Driving Directions  [address], [place] [address] . 10 am to 8 pm Monday-Friday . 12 pm to 8 pm Saturday-Sunday   . [facility] at [place]                  Get Driving Directions  [address], [place], [place] [address] . 8 am to 8 pm Monday-Friday . 9 am to 6 pm Saturday . 11 am to 6 pm Sunday   . [facility] at [place]                  Get Driving Directions   [address] [place], [place] [address] . 8 am to 8 pm Monday-Friday . 8 am to 4 pm Saturday-Sunday    . [facility] at [place]                    Get Driving Directions  [phone]  [address], [place], [place] [address]  . Monday-Friday, 12 PM to 6 PM    Your e-visit answers were reviewed by a board certified advanced clinical practitioner to complete your personal care plan.  Thank you for using e-Visits.

## 2019-04-01 ENCOUNTER — Other Ambulatory Visit: Payer: Self-pay

## 2019-04-02 ENCOUNTER — Encounter: Payer: Self-pay | Admitting: Family

## 2019-04-02 ENCOUNTER — Ambulatory Visit (INDEPENDENT_AMBULATORY_CARE_PROVIDER_SITE_OTHER): Payer: Self-pay | Admitting: Family

## 2019-04-02 VITALS — BP 134/84 | HR 88 | Temp 97.5°F | Resp 16 | Ht 71.0 in | Wt 283.0 lb

## 2019-04-02 DIAGNOSIS — R300 Dysuria: Secondary | ICD-10-CM | POA: Diagnosis not present

## 2019-04-02 DIAGNOSIS — N3000 Acute cystitis without hematuria: Secondary | ICD-10-CM

## 2019-04-02 LAB — POC URINALSYSI DIPSTICK (AUTOMATED)
Bilirubin, UA: NEGATIVE
Blood, UA: NEGATIVE
Glucose, UA: NEGATIVE
Ketones, UA: NEGATIVE
Leukocytes, UA: NEGATIVE
Nitrite, UA: NEGATIVE
Protein, UA: NEGATIVE
Spec Grav, UA: 1.025 (ref 1.010–1.025)
Urobilinogen, UA: 0.2 E.U./dL
pH, UA: 6 (ref 5.0–8.0)

## 2019-04-02 MED ORDER — SULFAMETHOXAZOLE-TRIMETHOPRIM 800-160 MG PO TABS: 1.0000 | ORAL_TABLET | Freq: Two times a day (BID) | ORAL | 0 refills | Status: DC

## 2019-04-02 NOTE — Progress Notes (Signed)
Subjective:    Patient ID: Rhonda Thompson, female    DOB: Nov 29, 1975, 43 y.o.   MRN: [id number]  HPI  Patient presents today to discuss UTI symptoms. States that she was seen at an urgent care on 10/8 and was given doxycycline for UTI.  Had no improvement and then did an evisit on 10/12 and was given cipro.  Symptoms still persisted despite cipro.  She repeated an Evisit on or an evisit on 10/21 and told she needed an office visit.  Today she reports + frequency and dysuria. Denies hematuria or fever. Does note some generalized low back aching.   Review of Systems See HPI  Past Medical History:  Diagnosis Date  . Anxiety    has panic attacks  . Depression   . OCD (obsessive compulsive disorder)   . PTSD (post-traumatic stress disorder)      Social History   Socioeconomic History  . Marital status: Single    Spouse name: Not on file  . Number of children: Not on file  . Years of education: Not on file  . Highest education level: Not on file  Occupational History  . Not on file  Social Needs  . Financial resource strain: Not on file  . Food insecurity    Worry: Not on file    Inability: Not on file  . Transportation needs    Medical: Not on file    Non-medical: Not on file  Tobacco Use  . Smoking status: Never Smoker  . Smokeless tobacco: Never Used  Substance and Sexual Activity  . Alcohol use: No    Alcohol/week: 0.0 standard drinks  . Drug use: Not on file  . Sexual activity: Not on file  Lifestyle  . Physical activity    Days per week: Not on file    Minutes per session: Not on file  . Stress: Not on file  Relationships  . Social Herbalist on phone: Not on file    Gets together: Not on file    Attends religious service: Not on file    Active member of club or organization: Not on file    Attends meetings of clubs or organizations: Not on file    Relationship status: Not on file  . Intimate partner violence    Fear of current or ex partner: Not  on file    Emotionally abused: Not on file    Physically abused: Not on file    Forced sexual activity: Not on file  Other Topics Concern  . 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 CHOLECYSTECTOMY  06/10/13    Family History  Problem Relation Age of Onset  . Hypertension Mother   . Diabetes Mother   . Cancer Mother        ovarian and kidney  . Depression Mother     Allergies  Allergen Reactions  . Caffeine Anxiety and Other (See Comments)  . Iodinated Diagnostic Agents Swelling    Hives and itching  . Other Rash    Bandaids  . Red Dye Other (See Comments)    headache  . Nitrofurantoin Other (See Comments)    headache  Current Outpatient Medications on File Prior to Visit  Medication Sig Dispense Refill  . ALPRAZolam (XANAX XR) 0.5 MG 24 hr tablet Take 1 tablet by mouth daily as needed.    . sertraline (ZOLOFT) 50 MG tablet Take 1 tablet by mouth daily.     No current facility-administered medications on file prior to visit.     BP 134/84 (BP Location: Right Arm, Patient Position: Sitting, Cuff Size: Large)   Pulse 88   Temp (!) 97.5 F (36.4 C) (Temporal)   Resp 16   Ht 5\' 11"  (1.803 m)   Wt 283 lb (128.4 kg)   SpO2 100%   BMI 39.47 kg/m       Objective:   Physical Exam Constitutional:      Appearance: She is well-developed.  Neck:     Musculoskeletal: Neck supple.     Thyroid: No thyromegaly.  Cardiovascular:     Rate and Rhythm: Normal rate and regular rhythm.     Heart sounds: Normal heart sounds. No murmur.  Pulmonary:     Effort: Pulmonary effort is normal. No respiratory distress.     Breath sounds: Normal breath sounds. No wheezing.  Abdominal:     Tenderness: There is abdominal tenderness in the suprapubic area. There is  no right CVA tenderness, left CVA tenderness, guarding or rebound.  Skin:    General: Skin is warm and dry.  Neurological:     Mental Status: She is alert and oriented to person, place, and time.  Psychiatric:        Behavior: Behavior normal.        Thought Content: Thought content normal.        Judgment: Judgment normal.           Assessment & Plan:  UTI- ua looks clear but will plan to treat based on symptoms while we wait for urine culture. Rx sent for bactrim. Pt is advised to call if symptoms worsen or if symptoms fail to improve.

## 2019-04-02 NOTE — Patient Instructions (Signed)
Please begin bactrim for urinary tract infection. Try to drink plenty of water.   Call if symptoms worsen or if symptoms fail to improve.

## 2019-04-03 LAB — URINE CULTURE
MICRO NUMBER:: 1023602
Result:: NO GROWTH
SPECIMEN QUALITY:: ADEQUATE

## 2019-04-05 ENCOUNTER — Telehealth: Payer: Self-pay | Admitting: Family

## 2019-04-05 NOTE — Telephone Encounter (Signed)
Please advise pt culture negative for infection. D/c bactrim. She should let me know if she has persistent urinary symptoms and I will refer her to urology.

## 2019-04-05 NOTE — Telephone Encounter (Signed)
Patient reports her symptoms are better, she will dc antibiotic. She will call if symptoms come back.

## 2019-04-07 ENCOUNTER — Encounter: Payer: Self-pay | Admitting: Family Medicine

## 2019-04-07 ENCOUNTER — Ambulatory Visit: Payer: BC Managed Care – PPO | Admitting: Family Medicine

## 2019-04-07 ENCOUNTER — Encounter: Payer: Self-pay | Admitting: Family

## 2019-04-07 ENCOUNTER — Other Ambulatory Visit: Payer: Self-pay

## 2019-04-07 VITALS — BP 132/86 | HR 78 | Temp 97.3°F | Resp 18 | Ht 71.0 in | Wt 283.0 lb

## 2019-04-07 DIAGNOSIS — R300 Dysuria: Secondary | ICD-10-CM

## 2019-04-07 DIAGNOSIS — R109 Unspecified abdominal pain: Secondary | ICD-10-CM | POA: Diagnosis not present

## 2019-04-07 DIAGNOSIS — N898 Other specified noninflammatory disorders of vagina: Secondary | ICD-10-CM

## 2019-04-07 LAB — POCT URINALYSIS DIP (MANUAL ENTRY)
Bilirubin, UA: NEGATIVE
Blood, UA: NEGATIVE
Glucose, UA: NEGATIVE mg/dL
Ketones, POC UA: NEGATIVE mg/dL
Leukocytes, UA: NEGATIVE
Nitrite, UA: NEGATIVE
Protein Ur, POC: NEGATIVE mg/dL
Spec Grav, UA: 1.01 (ref 1.010–1.025)
Urobilinogen, UA: 0.2 E.U./dL
pH, UA: 6.5 (ref 5.0–8.0)

## 2019-04-07 LAB — POCT URINE PREGNANCY: Preg Test, Ur: NEGATIVE

## 2019-04-07 NOTE — Patient Instructions (Addendum)
It was good to see you today I suspect you have a vaginal yeast infection due to recent antibiotic use Try an OTC yeast infection treatment such as monistat- generic is ok I will be in touch with your bloodwork and urine culture If you are getting worse in any way please alert me or seek emergency care if needed

## 2019-04-07 NOTE — Progress Notes (Addendum)
[place] [facility] [address], [place], [place] [address] [phone]  Date:  04/07/2019   Name:  Rhonda Thompson   DOB:  Jan 05, 1976   MRN:  [id number]  PCP:  Debbrah Alar, NP    Chief Complaint: Urinary Tract Infection   History of Present Illness:  Rhonda Thompson is a 43 y.o. very pleasant female patient who presents with the following:  In person visit today for possible UTI She did an evisit on 10/12 for possible UTI and was treated with cipro over the computer- she took all 5 days of treatment.  At the time of her ED visit she has started taking doxycycline, and had taken for about 3 days She felt better but hen relapsed, came in and saw Melissa on 10/23 and had a urine culture that was negative; she did not take the Septra that Melissa prescribed as her urine culture was negative Her urinary sx are much better- she does not have any dysuria or hematuria at this time. Currently she is more bothered by a feeling of unease in her abdomen.  She notes discomfort in her abdomen that has moved around to various locations over the last 2 days. 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  She got tested just today and was negative  She has been tested for covid several times recently-every time there is an exposure her work she is tested.  She has always been negative  She has noted a little vaginal discharge and itching-thinks that she might have a yeast infection  No new sexual partners or risk of sti per her report   LMP was on 10/5  She reports that her mother by died of a GYN cancer, and "since then I have not come  back to the gynecologist because there was nothing they could do for her" From her description, I suspect that her mother died of ovarian cancer.  I encouraged Jozalynn to continue getting routine Pap-although there is not a screening test for ovarian cancer, she should still have routine care and Pap screening   Patient Active Problem List   Diagnosis Date Noted  . Anxiety state 11/27/2015  . OCD (obsessive compulsive disorder) 11/27/2015  . Depression 11/27/2015    Past Medical History:  Diagnosis Date  . Anxiety    has panic attacks  . Depression   . OCD (obsessive compulsive disorder)   . PTSD (post-traumatic stress disorder)     Past Surgical History:  Procedure Laterality Date  . CHOLECYSTECTOMY  06/10/13    Social History   Tobacco Use  . Smoking status: Never Smoker  . Smokeless tobacco: Never Used  Substance Use Topics  . Alcohol use: No    Alcohol/week: 0.0 standard drinks  . Drug use: Not on file    Family History  Problem Relation Age of Onset  . Hypertension Mother   . Diabetes Mother   . Cancer Mother        ovarian and kidney  . Depression Mother     Allergies  Allergen Reactions  . Caffeine Anxiety and Other (See Comments)  . Iodinated Diagnostic  Agents Swelling    Hives and itching  . Other Rash    Bandaids  . Red Dye Other (See Comments)    headache  . Nitrofurantoin Other (See Comments)    headache    Medication list has been reviewed and updated.  Current Outpatient Medications on File Prior to Visit  Medication Sig Dispense Refill  . ALPRAZolam (XANAX XR) 0.5 MG 24 hr tablet Take 1 tablet by mouth daily as needed.    . sertraline (ZOLOFT) 50 MG tablet Take 1 tablet by mouth daily.    Marland Kitchen sulfamethoxazole-trimethoprim (BACTRIM DS) 800-160 MG tablet Take 1 tablet by mouth 2 (two) times daily. (Patient not taking: Reported on 04/07/2019) 10 tablet 0   No current facility-administered medications on file prior to visit.     Review of  Systems:  As per HPI- otherwise negative.  No fever or chills Physical Examination: Vitals:   04/07/19 1409  BP: 132/86  Pulse: 78  Resp: 18  Temp: (!) 97.3 F (36.3 C)  SpO2: 98%   Vitals:   04/07/19 1409  Weight: 283 lb (128.4 kg)  Height: _0  (1.803 m)   Body mass index is 39.47 kg/m. Ideal Body Weight: Weight in (lb) to have BMI = 25: 178.9  GEN: WDWN, NAD, Non-toxic, A & O x 3, obese, looks well HEENT: Atraumatic, Normocephalic. Neck supple. No masses, No LAD. Ears and Nose: No external deformity. CV: RRR, No M/G/R. No JVD. No thrill. No extra heart sounds. PULM: CTA B, no wheezes, crackles, rhonchi. No retractions. No resp. distress. No accessory muscle use. ABD: S, NT, ND, +BS. No rebound. No HSM.  The time of exam her abdomen is nontender EXTR: No c/c/e NEURO Normal gait.  PSYCH: Normally interactive. Conversant. Not depressed or anxious appearing.  Calm demeanor.  Patient declines a pelvic exam at this time  Results for orders placed or performed in visit on 04/07/19  Urine Culture   Specimen: Urine  Result Value Ref Range   MICRO NUMBER: [id number]    SPECIMEN QUALITY: Adequate    Sample Source URINE    STATUS: FINAL    ISOLATE 1:      Less than 10,000 CFU/mL of single Gram positive organism isolated. No further testing will be performed. If clinically indicated, recollection using a method to minimize contamination, with prompt transfer to Urine Culture Transport Tube, is recommended.  CBC  Result Value Ref Range   WBC 9.0 4.0 - 10.5 K/uL   RBC 4.52 3.87 - 5.11 Mil/uL   Platelets 282.0 150.0 - 400.0 K/uL   Hemoglobin 12.7 12.0 - 15.0 g/dL   HCT 38.2 36.0 - 46.0 %   MCV 84.5 78.0 - 100.0 fl   MCHC 33.2 30.0 - 36.0 g/dL   RDW 14.6 11.5 - 15.5 %  Comprehensive metabolic panel  Result Value Ref Range   Sodium 138 135 - 145 mEq/L   Potassium 4.4 3.5 - 5.1 mEq/L   Chloride 103 96 - 112 mEq/L   CO2 26 19 - 32 mEq/L   Glucose, Bld 84 70 - 99 mg/dL    BUN 10 6 - 23 mg/dL   Creatinine, Ser 0.89 0.40 - 1.20 mg/dL   Total Bilirubin 0.6 0.2 - 1.2 mg/dL   Alkaline Phosphatase 49 39 - 117 U/L   AST 16 0 - 37 U/L   ALT 13 0 - 35 U/L   Total Protein 7.0 6.0 - 8.3 g/dL   Albumin 4.4 3.5 - 5.2 g/dL  Calcium 9.1 8.4 - 10.5 mg/dL   GFR 69.65 >60.00 mL/min  POCT urinalysis dipstick  Result Value Ref Range   Color, UA yellow yellow   Clarity, UA clear clear   Glucose, UA negative negative mg/dL   Bilirubin, UA negative negative   Ketones, POC UA negative negative mg/dL   Spec Grav, UA 1.010 1.010 - 1.025   Blood, UA negative negative   pH, UA 6.5 5.0 - 8.0   Protein Ur, POC negative negative mg/dL   Urobilinogen, UA 0.2 0.2 or 1.0 E.U./dL   Nitrite, UA Negative Negative   Leukocytes, UA Negative Negative  POCT urine pregnancy  Result Value Ref Range   Preg Test, Ur Negative Negative    Assessment and Plan: Dysuria - Plan: Urine Culture, POCT urinalysis dipstick  Abdominal discomfort - Plan: POCT urine pregnancy, CBC, Comprehensive metabolic panel  Vaginal itching  Here today with concern of previous urinary symptoms, suspicious for UTI.  UA today is negative, will send culture She notes mild vaginal itching after recent antibiotic use x2.  Suspect monilia.  Offered you a pelvic exam but she declines at this time.  She is allergic to red dye so did not prescribe Diflucan.  Advised her to purchase an over-the-counter yeast treatment such as Monistat She notes somewhat vague and mobile abdominal discomfort for the last few days.  Also some diarrhea.  She admits to eating a very hot and spicy snack, and also recently took antibiotics.  Her exam today is non-focal-may be related to recent antibiotic use.  She does not have an acute abdomen  I will obtain a CBC and C met as above.  Encouraged her to try a probiotic or Kefir drink to replace good bacteria Advised her that we do not have a definite diagnosis for her at this time.  If her  symptoms are getting worse or changing please let me know or otherwise seek care  Signed Lamar Blinks, MD  Received her labs 10/29- message to pt Just waiting on urine culture   Results for orders placed or performed in visit on 04/07/19  Urine Culture   Specimen: Urine  Result Value Ref Range   MICRO NUMBER: [id number]    SPECIMEN QUALITY: Adequate    Sample Source URINE    STATUS: FINAL    ISOLATE 1:      Less than 10,000 CFU/mL of single Gram positive organism isolated. No further testing will be performed. If clinically indicated, recollection using a method to minimize contamination, with prompt transfer to Urine Culture Transport Tube, is recommended.  CBC  Result Value Ref Range   WBC 9.0 4.0 - 10.5 K/uL   RBC 4.52 3.87 - 5.11 Mil/uL   Platelets 282.0 150.0 - 400.0 K/uL   Hemoglobin 12.7 12.0 - 15.0 g/dL   HCT 38.2 36.0 - 46.0 %   MCV 84.5 78.0 - 100.0 fl   MCHC 33.2 30.0 - 36.0 g/dL   RDW 14.6 11.5 - 15.5 %  Comprehensive metabolic panel  Result Value Ref Range   Sodium 138 135 - 145 mEq/L   Potassium 4.4 3.5 - 5.1 mEq/L   Chloride 103 96 - 112 mEq/L   CO2 26 19 - 32 mEq/L   Glucose, Bld 84 70 - 99 mg/dL   BUN 10 6 - 23 mg/dL   Creatinine, Ser 0.89 0.40 - 1.20 mg/dL   Total Bilirubin 0.6 0.2 - 1.2 mg/dL   Alkaline Phosphatase 49 39 - 117 U/L   AST  16 0 - 37 U/L   ALT 13 0 - 35 U/L   Total Protein 7.0 6.0 - 8.3 g/dL   Albumin 4.4 3.5 - 5.2 g/dL   Calcium 9.1 8.4 - 10.5 mg/dL   GFR 69.65 >60.00 mL/min  POCT urinalysis dipstick  Result Value Ref Range   Color, UA yellow yellow   Clarity, UA clear clear   Glucose, UA negative negative mg/dL   Bilirubin, UA negative negative   Ketones, POC UA negative negative mg/dL   Spec Grav, UA 1.010 1.010 - 1.025   Blood, UA negative negative   pH, UA 6.5 5.0 - 8.0   Protein Ur, POC negative negative mg/dL   Urobilinogen, UA 0.2 0.2 or 1.0 E.U./dL   Nitrite, UA Negative Negative   Leukocytes, UA Negative Negative   POCT urine pregnancy  Result Value Ref Range   Preg Test, Ur Negative Negative   Received her urine culture 10/30- message to pt

## 2019-04-07 NOTE — Telephone Encounter (Signed)
I think she should be re-evaluated please.  Maybe we can get her in with another provider this afternoon?

## 2019-04-08 ENCOUNTER — Encounter: Payer: Self-pay | Admitting: Family Medicine

## 2019-04-08 DIAGNOSIS — R1084 Generalized abdominal pain: Secondary | ICD-10-CM

## 2019-04-08 LAB — COMPREHENSIVE METABOLIC PANEL
ALT: 13 U/L (ref 0–35)
AST: 16 U/L (ref 0–37)
Albumin: 4.4 g/dL (ref 3.5–5.2)
Alkaline Phosphatase: 49 U/L (ref 39–117)
BUN: 10 mg/dL (ref 6–23)
CO2: 26 mEq/L (ref 19–32)
Calcium: 9.1 mg/dL (ref 8.4–10.5)
Chloride: 103 mEq/L (ref 96–112)
Creatinine, Ser: 0.89 mg/dL (ref 0.40–1.20)
GFR: 69.65 mL/min (ref >60.00)
Glucose, Bld: 84 mg/dL (ref 70–99)
Potassium: 4.4 mEq/L (ref 3.5–5.1)
Sodium: 138 mEq/L (ref 135–145)
Total Bilirubin: 0.6 mg/dL (ref 0.2–1.2)
Total Protein: 7 g/dL (ref 6.0–8.3)

## 2019-04-08 LAB — CBC
HCT: 38.2 % (ref 36.0–46.0)
Hemoglobin: 12.7 g/dL (ref 12.0–15.0)
MCHC: 33.2 g/dL (ref 30.0–36.0)
MCV: 84.5 fl (ref 78.0–100.0)
Platelets: 282 10*3/uL (ref 150.0–400.0)
RBC: 4.52 Mil/uL (ref 3.87–5.11)
RDW: 14.6 % (ref 11.5–15.5)
WBC: 9 10*3/uL (ref 4.0–10.5)

## 2019-04-09 LAB — URINE CULTURE
MICRO NUMBER:: 1040166
SPECIMEN QUALITY:: ADEQUATE

## 2019-04-12 NOTE — Addendum Note (Signed)
Addended by: Lamar Blinks C on: 04/12/2019 12:25 PM   Modules accepted: Orders

## 2019-04-14 ENCOUNTER — Telehealth: Payer: Self-pay | Admitting: Family Medicine

## 2019-04-14 ENCOUNTER — Encounter: Payer: Self-pay | Admitting: Family Medicine

## 2019-04-14 DIAGNOSIS — R1084 Generalized abdominal pain: Secondary | ICD-10-CM

## 2019-04-14 NOTE — Addendum Note (Signed)
Addended by: Lamar Blinks C on: 04/14/2019 03:38 PM   Modules accepted: Orders

## 2019-04-14 NOTE — Telephone Encounter (Signed)
Pt stated she was scheduled for a CT w/ Contrast at [facility] however she is allergic to contrast and needs new order sent so that she can have CT done as soon as possible as she is still in a lot of pain. Requesting a CB when this has been done because [facility] failed to contact pt to get scheduled initially. Please advise.

## 2019-04-14 NOTE — Telephone Encounter (Signed)
Ok, taken care of.  Mel Almond to call [facility] in AM to put pt on walk- in list

## 2019-04-14 NOTE — Telephone Encounter (Signed)
Patient requesting call back from CMA to discuss scheduling CT. She states unless it is entered as STAT, she will have to wait an additional week to be seen. Please advise.

## 2019-04-15 ENCOUNTER — Ambulatory Visit
Admission: RE | Admit: 2019-04-15 | Discharge: 2019-04-15 | Disposition: A | Discharge status: Home / Self Care | Payer: BC Managed Care – PPO | Source: Ambulatory Visit | Attending: Family Medicine | Admitting: Family Medicine

## 2019-04-15 DIAGNOSIS — K439 Ventral hernia without obstruction or gangrene: Secondary | ICD-10-CM | POA: Diagnosis not present

## 2019-04-15 DIAGNOSIS — R1084 Generalized abdominal pain: Secondary | ICD-10-CM

## 2019-04-15 NOTE — Telephone Encounter (Signed)
Patient had ct done this am.

## 2019-04-15 NOTE — Telephone Encounter (Signed)
Received her CT scan results as follows:  Ct Abdomen Pelvis Wo Contrast  Result Date: 04/15/2019 CLINICAL DATA:  Abdominal pain EXAM: CT ABDOMEN AND PELVIS WITHOUT CONTRAST TECHNIQUE: Multidetector CT imaging of the abdomen and pelvis was performed following the standard protocol without oral or IV contrast. COMPARISON:  None. FINDINGS: Lower chest: Lung bases are clear. Hepatobiliary: No focal liver lesions are evident on this noncontrast enhanced study. Gallbladder is absent. There is no evident biliary duct dilatation. Pancreas: There is no pancreatic mass or inflammatory focus. Spleen: No splenic lesions are evident. Adrenals/Urinary Tract: Adrenals bilaterally appear normal. Kidneys bilaterally show no evident mass or hydronephrosis on either side. There is no evident renal or ureteral calculus on either side. Urinary bladder is midline with wall thickness within normal limits. Stomach/Bowel: There is no appreciable bowel wall or mesenteric thickening. Terminal ileum appears normal. There is mild fatty infiltration in the ileocecal valve. There is no evident bowel obstruction. There is no free air or portal venous air. Vascular/Lymphatic: No abdominal aortic aneurysm. No vascular lesions are evident on this noncontrast enhanced study. There is a retroaortic left renal vein, an anatomic variant. No adenopathy is evident in the abdomen or pelvis. Reproductive: Uterus is anteverted.  No pelvic masses are evident. Other: Appendix appears normal. No appreciable abscess or ascites in the abdomen or pelvis. There is scarring at the level of the umbilicus. There is a tiny ventral hernia slightly superior to the umbilicus containing only fat. Musculoskeletal: There is degenerative change in the lower lumbar spine. There are no blastic or lytic bone lesions. There is no intramuscular lesion. IMPRESSION: 1. No bowel wall thickening or bowel obstruction evident. No abscess in the abdomen or pelvis. Appendix appears  normal. 2. No evident renal or ureteral calculus. No hydronephrosis. Urinary bladder wall thickness is normal. 3. Rather minimal midline ventral hernia slightly superior to the umbilicus containing only fat. Electronically Signed   By: Lowella Grip III M.D.   On: 04/15/2019 09:40   Went over her result.  CT is benign, she has known ventral hernia since childhood. She continues to have issues with her stomach rumbling, feeling sensitive, and having diarrhea after eating. No vomiting She notes pain in her abdomen that will move around-she describes it as a fluttering, sometimes feels more like surface pain than deep inside pain. No fever- she is checked on a regular basis at work.  She has been tested for covid twice and been negative  Recent CBC and C met normal and reassuring I advised her that at this point we do not have a definite explanation for her symptoms, but her CT scan and labs are reassuring.  I considered a Bentyl prescription, but this may cross-react with her red dye allergy.  Plan to have her see gastroenterology hopefully next week.  If she is getting worse or having problems over the weekend she will contact me.  She states understanding and agreement with our plan

## 2019-04-19 ENCOUNTER — Encounter: Payer: Self-pay | Admitting: Nurse Practitioner

## 2019-04-27 ENCOUNTER — Encounter

## 2019-04-27 ENCOUNTER — Ambulatory Visit: Payer: BC Managed Care – PPO | Admitting: Nurse Practitioner

## 2019-04-27 ENCOUNTER — Other Ambulatory Visit (INDEPENDENT_AMBULATORY_CARE_PROVIDER_SITE_OTHER): Payer: BC Managed Care – PPO

## 2019-04-27 ENCOUNTER — Encounter: Payer: Self-pay | Admitting: Nurse Practitioner

## 2019-04-27 ENCOUNTER — Other Ambulatory Visit: Payer: Self-pay

## 2019-04-27 VITALS — BP 120/80 | HR 73 | Temp 98.5°F | Ht 71.0 in | Wt 286.0 lb

## 2019-04-27 DIAGNOSIS — K219 Gastro-esophageal reflux disease without esophagitis: Secondary | ICD-10-CM

## 2019-04-27 DIAGNOSIS — R194 Change in bowel habit: Secondary | ICD-10-CM

## 2019-04-27 DIAGNOSIS — R1011 Right upper quadrant pain: Secondary | ICD-10-CM | POA: Diagnosis not present

## 2019-04-27 LAB — H. PYLORI ANTIBODY, IGG: H Pylori IgG: POSITIVE — AB

## 2019-04-27 MED ORDER — OMEPRAZOLE 40 MG PO CPDR: 40.0000 mg | DELAYED_RELEASE_CAPSULE | ORAL | 3 refills | Status: DC

## 2019-04-27 NOTE — Progress Notes (Signed)
ASSESSMENT / PLAN:   68. 43 yo female with RUQ abdominal pain.  In the setting of NSAIDs need to exclude PUD. -She has already discontinued NSAIDs, taking Tylenol for headaches now. -H. pylori IgG -We discussed options such as empirically treating with PPI and/or upper endoscopy.  Right now patient has no alarm symptoms and would like to hold off on EGD.  She will call our office for vomiting, hematemesis, hematochezia or worsening abdominal pain.  2. Bowel habit changes-  alternating constipation/ diarrhea.  Bowel changes in the setting of antibiotics 4 weeks ago for UTI. -Even though bowel movements are not purely diarrhea, I think checking a C. difficile is worthwhile. -Okay to continue probiotics -Follow-up with me in approximately 4 weeks, sooner if needed  3. GERD manifested as heartburn -Reflux measures discussed, written literature given. -Trial of omeprazole 40 mg 30 minutes before breakfast.   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  She describes right upper quadrant spasms and right mid abdominal pain.  Spicy food aggravates the pain as does Advil.  She takes Advil 2-3 times a week for headaches.  She has occasional postprandial nausea.  No vomiting / no hematemesis. She has also been having bowel changes over the last few weeks in the form of alternating constipation/diarrhea.  She took antibiotics approximately 4 weeks ago for UTI.  PCP started her on probiotics which are helping.  Patient has not seen any blood in her stool.  No fevers or unusual weight loss.  Rhonda Thompson has a history of GERD.  She gets terrible heartburn on an empty  stomach but water also makes the heartburn worse.  Heartburn is not really related to eating, it is not particularly bad at night.   Data Reviewed:  04/07/2019 C-Met, CBC normal  04/15/2019  CTAP without contrast -unremarkable  Past Medical History:  Diagnosis Date  . Anxiety    has panic attacks  . Depression   . OCD (obsessive compulsive disorder)   . PTSD (post-traumatic stress disorder)      Past Surgical History:  Procedure Laterality Date  . CHOLECYSTECTOMY  06/10/13   Family History  Problem Relation Age of Onset  . Hypertension Mother   . Diabetes Mother   . Cancer Mother        ovarian and kidney  . Depression Mother    Social History   Tobacco Use  . Smoking status: Never Smoker  . Smokeless tobacco: Never Used  Substance Use Topics  . Alcohol use: No    Alcohol/week: 0.0 standard drinks  . Drug use: Not on file   Current Outpatient Medications  Medication Sig Dispense Refill  . acetaminophen (TYLENOL) 500 MG tablet Take 500 mg by mouth every 6 (six) hours as needed.    . ALPRAZolam (XANAX XR) 0.5 MG 24 hr tablet Take 1 tablet by mouth daily as needed.     No current facility-administered medications for this visit.    Allergies  Allergen Reactions  . Caffeine Anxiety and Other (See Comments)  . Iodinated Diagnostic Agents Swelling    Hives and  itching  . Other Rash    Bandaids  . Red Dye Other (See Comments)    headache  . Nitrofurantoin Other (See Comments)    headache     Review of Systems: As for anxiety, depression, fatigue, headaches, menstrual pain, night sweats, skin rash, sleeping problems, urinary pain and frequency . All other systems reviewed and negative except where noted in HPI.   Serum creatinine: 0.89 mg/dL 04/07/19 1435 Estimated creatinine clearance: 124 mL/min   Physical Exam:    Wt Readings from Last 3 Encounters:  04/27/19 286 lb (129.7 kg)  04/07/19 283 lb (128.4 kg)  04/02/19 283 lb (128.4 kg)    Ht '5\' 11"'   (1.803 m)   Wt 286 lb (129.7 kg)   BMI 39.89 kg/m  Constitutional:  Pleasant female in no acute distress. Psychiatric: Normal mood and affect. Behavior is normal. EENT: Pupils normal.  Conjunctivae are normal. No scleral icterus. Neck supple.  Cardiovascular: Normal rate, regular rhythm. No edema Pulmonary/chest: Effort normal and breath sounds normal. No wheezing, rales or rhonchi. Abdominal: Soft, nondistended, nontender. Bowel sounds active throughout. There are no masses palpable. No hepatomegaly. Neurological: Alert and oriented to person place and time. Skin: Skin is warm and dry. No rashes noted.  Tye Savoy, NP  04/27/2019, 8:40 AM  Cc: Rhonda Alar, NP

## 2019-04-27 NOTE — Patient Instructions (Addendum)
If you are age 41 or older, your body mass index should be between 23-30. Your Body mass index is 39.89 kg/m. If this is out of the aforementioned range listed, please consider follow up with your Primary Care Provider.  If you are age 76 or younger, your body mass index should be between 19-25. Your Body mass index is 39.89 kg/m. If this is out of the aformentioned range listed, please consider follow up with your Primary Care Provider.   Your provider has requested that you go to the basement level for lab work before leaving today. Press "B" on the elevator. The lab is located at the first door on the left as you exit the elevator.  We have sent the following medications to your pharmacy for you to pick up at your convenience:  Omeprazole  AVOID NSAIDS (motrin, advil, aleve etc)  Continue probiotics.  You have been given GERD literature.  Follow up with me on 06/09/19 at 8:30 am.  Thank you for choosing me and [facility].   Tye Savoy, NP

## 2019-04-29 ENCOUNTER — Encounter: Payer: Self-pay | Admitting: Nurse Practitioner

## 2019-04-30 ENCOUNTER — Other Ambulatory Visit: Payer: Self-pay

## 2019-04-30 ENCOUNTER — Telehealth: Payer: Self-pay | Admitting: Nurse Practitioner

## 2019-04-30 MED ORDER — METRONIDAZOLE 250 MG PO TABS: 250.0000 mg | ORAL_TABLET | Freq: Three times a day (TID) | ORAL | 0 refills | Status: DC

## 2019-04-30 MED ORDER — DOXYCYCLINE HYCLATE 100 MG PO CAPS: 100.0000 mg | ORAL_CAPSULE | Freq: Two times a day (BID) | ORAL | 0 refills | Status: AC

## 2019-04-30 MED ORDER — ONDANSETRON HCL 4 MG PO TABS: 4.0000 mg | ORAL_TABLET | Freq: Three times a day (TID) | ORAL | 1 refills | Status: DC | PRN

## 2019-04-30 MED ORDER — BISMUTH SUBSALICYLATE 262 MG PO CHEW: 524.0000 mg | CHEWABLE_TABLET | Freq: Four times a day (QID) | ORAL | 0 refills | Status: AC

## 2019-04-30 MED ORDER — OMEPRAZOLE 20 MG PO CPDR: 20.0000 mg | DELAYED_RELEASE_CAPSULE | Freq: Every day | ORAL | 3 refills | Status: DC

## 2019-04-30 NOTE — Telephone Encounter (Signed)
See the lab note

## 2019-05-03 ENCOUNTER — Telehealth: Payer: Self-pay | Admitting: Nurse Practitioner

## 2019-05-03 ENCOUNTER — Other Ambulatory Visit: Payer: Self-pay

## 2019-05-03 DIAGNOSIS — A048 Other specified bacterial intestinal infections: Secondary | ICD-10-CM

## 2019-05-03 NOTE — Telephone Encounter (Signed)
Spoke with the patient. She will take her probiotic in the evenings. She is dealing with a lot of morning nausea. Sometimes she has nausea in the middle of the night that wakes her up. She wants to complete the antibiotics despite this issue. She will call me if she has worsening issues or fails to improve.

## 2019-05-04 ENCOUNTER — Other Ambulatory Visit: Payer: Self-pay

## 2019-05-04 ENCOUNTER — Telehealth: Payer: Self-pay | Admitting: Nurse Practitioner

## 2019-05-04 MED ORDER — ONDANSETRON HCL 4 MG PO TABS: 4.0000 mg | ORAL_TABLET | Freq: Three times a day (TID) | ORAL | 0 refills | Status: DC | PRN

## 2019-05-04 NOTE — Telephone Encounter (Signed)
Done

## 2019-05-13 ENCOUNTER — Other Ambulatory Visit: Payer: Self-pay

## 2019-05-13 ENCOUNTER — Telehealth: Payer: Self-pay | Admitting: *Deleted

## 2019-05-13 DIAGNOSIS — K219 Gastro-esophageal reflux disease without esophagitis: Secondary | ICD-10-CM

## 2019-05-13 NOTE — Telephone Encounter (Signed)
Absolutely.  No problems by me. Nevin Bloodgood is my right hand person.  RG

## 2019-05-13 NOTE — Telephone Encounter (Signed)
Dr. Lyndel Safe, please see below. Pt is currently working with Tye Savoy, would you approve the switch?  Thanks

## 2019-05-13 NOTE — Telephone Encounter (Signed)
Copied from [place] [id number]. Topic: General - Call Back - No Documentation >> May 13, 2019 10:29 AM Reyne Dumas L wrote: Reason for CRM:   Pt states she just missed a call from the office with no message left.

## 2019-05-13 NOTE — Telephone Encounter (Signed)
Copied from [place] [id number]. Topic: General - Inquiry >> May 13, 2019  9:49 AM Mathis Bud wrote: Reason for CRM: Patient is requesting a different referral to a different GI doctor.  Patient did not like the GI doctor in [place], she said it is not working out for her.  Patient wants to make sure the new referral is in her network.  Patient states Dr.Copland made the referral.   Call back [phone]

## 2019-05-13 NOTE — Telephone Encounter (Signed)
Lvm for patient to call wanted to ask if she will like to see another GI in [place] or if she will like to come to [place]

## 2019-05-13 NOTE — Telephone Encounter (Signed)
error 

## 2019-05-13 NOTE — Telephone Encounter (Signed)
Patient will like to be seen in [place], please advise if ok to initiate referral to Dr. Lyndel Safe.

## 2019-05-14 NOTE — Telephone Encounter (Signed)
Patient advised referral was entered and to let us know if she does not hear from them in a week

## 2019-05-14 NOTE — Addendum Note (Signed)
Addended by: Debbrah Alar on: 05/14/2019 07:50 AM   Modules accepted: Orders

## 2019-05-14 NOTE — Telephone Encounter (Signed)
Please advise pt that I will set her up with Dr. Lyndel Safe at [facility].

## 2019-05-18 ENCOUNTER — Telehealth: Payer: Self-pay | Admitting: Internal Medicine

## 2019-05-18 NOTE — Telephone Encounter (Signed)
Dr. Carlean Purl We received a referral for  your patient however the patient is wanting to be seen by Dr. Lyndel Safe at the [facility] location it is closer for her. Will allow this transfer?

## 2019-05-20 DIAGNOSIS — R1031 Right lower quadrant pain: Secondary | ICD-10-CM | POA: Diagnosis not present

## 2019-05-20 DIAGNOSIS — R1013 Epigastric pain: Secondary | ICD-10-CM | POA: Diagnosis not present

## 2019-05-20 DIAGNOSIS — R768 Other specified abnormal immunological findings in serum: Secondary | ICD-10-CM | POA: Diagnosis not present

## 2019-05-20 DIAGNOSIS — R1011 Right upper quadrant pain: Secondary | ICD-10-CM | POA: Diagnosis not present

## 2019-05-20 NOTE — Telephone Encounter (Signed)
By all means she may see Dr. Lyndel Safe

## 2019-05-20 NOTE — Telephone Encounter (Signed)
OK by me RG 

## 2019-06-08 ENCOUNTER — Encounter: Payer: Self-pay | Admitting: Adult Health

## 2019-06-08 ENCOUNTER — Ambulatory Visit (INDEPENDENT_AMBULATORY_CARE_PROVIDER_SITE_OTHER): Payer: BC Managed Care – PPO | Admitting: Adult Health

## 2019-06-08 DIAGNOSIS — F331 Major depressive disorder, recurrent, moderate: Secondary | ICD-10-CM | POA: Diagnosis not present

## 2019-06-08 DIAGNOSIS — F410 Panic disorder [episodic paroxysmal anxiety] without agoraphobia: Secondary | ICD-10-CM | POA: Diagnosis not present

## 2019-06-08 DIAGNOSIS — F411 Generalized anxiety disorder: Secondary | ICD-10-CM | POA: Diagnosis not present

## 2019-06-08 DIAGNOSIS — F429 Obsessive-compulsive disorder, unspecified: Secondary | ICD-10-CM | POA: Diagnosis not present

## 2019-06-08 DIAGNOSIS — F4310 Post-traumatic stress disorder, unspecified: Secondary | ICD-10-CM

## 2019-06-08 MED ORDER — SERTRALINE HCL 100 MG PO TABS: 100.0000 mg | ORAL_TABLET | Freq: Every day | ORAL | 1 refills | Status: DC

## 2019-06-08 MED ORDER — ALPRAZOLAM ER 0.5 MG PO TB24: 0.5000 mg | ORAL_TABLET | Freq: Two times a day (BID) | ORAL | 2 refills | Status: DC

## 2019-06-08 NOTE — Progress Notes (Signed)
Rhonda Kaufmanracy Gumbs [id number] 03/01/1976 43 y.o.  Virtual Visit via Telephone Note  I connected with pt on 06/08/19 at  8:00 AM EST by telephone and verified that I am speaking with the correct person using two identifiers.   I discussed the limitations, risks, security and privacy concerns of performing an evaluation and management service by telephone and the availability of in person appointments. I also discussed with the patient that there may be a patient responsible charge related to this service. The patient expressed understanding and agreed to proceed.   I discussed the assessment and treatment plan with the patient. The patient was provided an opportunity to ask questions and all were answered. The patient agreed with the plan and demonstrated an understanding of the instructions.   The patient was advised to call back or seek an in-person evaluation if the symptoms worsen or if the condition fails to improve as anticipated.  I provided 30 minutes of non-face-to-face time during this encounter.  The patient was located at home.  The provider was located at [facility].   Dorothyann Gibbsegina N Arra Connaughton, NP   Subjective:   Patient ID:  Rhonda Thompson is a 43 y.o. (DOB 03/13/1976) female.  Chief Complaint:  Chief Complaint  Patient presents with  . Depression  . Anxiety  . Panic Attack  . Trauma  . Other    OCD    HPI Rhonda Kaufmanracy Krichbaum presents for follow-up of GAD, MDD, OCD, PTSD, and Panic attacks.  Describes mood today as "ok". Pleasant. Mood symptoms - reports depression, anxiety, and irritability. Stating "I am feeling a little better". Mood symptoms worsening with diagnoses of H Pylori and having to stop Zoloft. Has restarted Zoloft and feels symptoms are "easing". Has tests scheduled next week to determine cause of the H-pylori. Also struggles with "this time of year". Outbreak of Covid in her facility. Has tested negative - but 148 of her coworkers have tested positive. Unable to  see niece - has been with her for 16 years - having to stay with her mother. Stable interest and motivation. Taking medications as prescribed.  Energy levels stable. Active, does not have a regular exercise routine. Works full-time. Enjoys some usual interests and activities. Single. Lives with niece and dog. Sister local. Spending time with family. Appetite adequate. Weight stable. Sleeps well most nights. Averages 6 to 8 hours. Focus and concentration stable. Completing tasks. Managing aspects of household.Work stressful. Denies SI or HI. Denies AH or VH.  Review of Systems:  Review of Systems  Musculoskeletal: Negative for gait problem.  Neurological: Negative for tremors.  Psychiatric/Behavioral:       Please refer to HPI    Medications: I have reviewed the patient's current medications.  Current Outpatient Medications  Medication Sig Dispense Refill  . acetaminophen (TYLENOL) 500 MG tablet Take 500 mg by mouth every 6 (six) hours as needed.    . ALPRAZolam (XANAX XR) 0.5 MG 24 hr tablet Take 1 tablet (0.5 mg total) by mouth 2 (two) times daily. 60 tablet 2  . metroNIDAZOLE (FLAGYL) 250 MG tablet Take 1 tablet (250 mg total) by mouth 3 (three) times daily. 30 tablet 0  . omeprazole (PRILOSEC) 40 MG capsule Take 1 capsule (40 mg total) by mouth every morning. Take 30-60 minutes before breakfast. 30 capsule 3  . ondansetron (ZOFRAN) 4 MG tablet Take 1 tablet (4 mg total) by mouth every 8 (eight) hours as needed for nausea or vomiting. 30 tablet 0  . sertraline (ZOLOFT) 100  MG tablet Take 1 tablet (100 mg total) by mouth daily. 90 tablet 1   No current facility-administered medications for this visit.    Medication Side Effects: None  Allergies:  Allergies  Allergen Reactions  . Caffeine Anxiety and Other (See Comments)  . Iodinated Diagnostic Agents Swelling    Hives and itching  . Other Rash    Bandaids  . Red Dye Other (See Comments)    headache  . Nitrofurantoin Other  (See Comments)    headache    Past Medical History:  Diagnosis Date  . Anxiety    has panic attacks  . Depression   . OCD (obsessive compulsive disorder)   . PTSD (post-traumatic stress disorder)     Family History  Problem Relation Age of Onset  . Hypertension Mother   . Diabetes Mother   . Cancer Mother        ovarian and kidney  . Depression Mother     Social History   Socioeconomic History  . Marital status: Single    Spouse name: Not on file  . Number of children: Not on file  . Years of education: Not on file  . Highest education level: Not on file  Occupational History  . Not on file  Tobacco Use  . Smoking status: Never Smoker  . Smokeless tobacco: Never Used  Substance and Sexual Activity  . Alcohol use: No    Alcohol/week: 0.0 standard drinks  . Drug use: Not on file  . Sexual activity: Not on file  Other Topics Concern  . Not on file  Social History Narrative   Lives with her mom   Lost a child 5-6   Raising her niece since she was born (she is age 61)   Works as a Medical sales representative for [employer]   2 pit bull rescues   Social Determinants of Radio broadcast assistant Strain:   . Difficulty of Paying Living Expenses: Not on file  Food Insecurity:   . Worried About Charity fundraiser in the Last Year: Not on file  . Ran Out of Food in the Last Year: Not on file  Transportation Needs:   . Lack of Transportation (Medical): Not on file  . Lack of Transportation (Non-Medical): Not on file  Physical Activity:   . Days of Exercise per Week: Not on file  . Minutes of Exercise per Session: Not on file  Stress:   . Feeling of Stress : Not on file  Social Connections:   . Frequency of Communication with Friends and Family: Not on file  . Frequency of Social Gatherings with Friends and Family: Not on file  . Attends Religious Services: Not on file  . Active Member of Clubs or Organizations: Not on file  . Attends Archivist Meetings:  Not on file  . Marital Status: Not on file  Intimate Partner Violence:   . Fear of Current or Ex-Partner: Not on file  . Emotionally Abused: Not on file  . Physically Abused: Not on file  . Sexually Abused: Not on file    Past Medical History, Surgical history, Social history, and Family history were reviewed and updated as appropriate.   Please see review of systems for further details on the patient's review from today.   Objective:   Physical Exam:  There were no vitals taken for this visit.  Physical Exam Neurological:     Mental Status: She is alert and oriented to person, place,  and time.     Cranial Nerves: No dysarthria.  Psychiatric:        Attention and Perception: Attention and perception normal.        Mood and Affect: Mood is anxious and depressed.        Speech: Speech normal.        Behavior: Behavior is cooperative.        Thought Content: Thought content normal. Thought content is not paranoid or delusional. Thought content does not include homicidal or suicidal ideation. Thought content does not include homicidal or suicidal plan.        Cognition and Memory: Cognition and memory normal.        Judgment: Judgment normal.     Comments: Insight intact    Lab Review:     Component Value Date/Time   NA 138 04/07/2019 1435   K 4.4 04/07/2019 1435   CL 103 04/07/2019 1435   CO2 26 04/07/2019 1435   GLUCOSE 84 04/07/2019 1435   BUN 10 04/07/2019 1435   CREATININE 0.89 04/07/2019 1435   CALCIUM 9.1 04/07/2019 1435   PROT 7.0 04/07/2019 1435   ALBUMIN 4.4 04/07/2019 1435   AST 16 04/07/2019 1435   ALT 13 04/07/2019 1435   ALKPHOS 49 04/07/2019 1435   BILITOT 0.6 04/07/2019 1435       Component Value Date/Time   WBC 9.0 04/07/2019 1435   RBC 4.52 04/07/2019 1435   HGB 12.7 04/07/2019 1435   HCT 38.2 04/07/2019 1435   PLT 282.0 04/07/2019 1435   MCV 84.5 04/07/2019 1435   MCHC 33.2 04/07/2019 1435   RDW 14.6 04/07/2019 1435   LYMPHSABS 1.7  06/20/2017 0852   MONOABS 0.5 06/20/2017 0852   EOSABS 0.2 06/20/2017 0852   BASOSABS 0.1 06/20/2017 0852    No results found for: POCLITH, LITHIUM   No results found for: PHENYTOIN, PHENOBARB, VALPROATE, CBMZ   .res Assessment: Plan:    Plan:  1. Zoloft 50mg  daily x 7 - plans to go up to 100mg  and then holding until testing is completing. 2. Xanax XR 0.5mg  twice daily - will cal in 3 months for a refill.  RTC 6 months  Patient advised to contact office with any questions, adverse effects, or acute worsening in signs and symptoms.  Discussed potential benefits, risk, and side effects of benzodiazepines to include potential risk of tolerance and dependence, as well as possible drowsiness.  Advised patient not to drive if experiencing drowsiness and to take lowest possible effective dose to minimize risk of dependence and tolerance.  Zyaire was seen today for depression, anxiety, panic attack, trauma and other.  Diagnoses and all orders for this visit:  Generalized anxiety disorder  Major depressive disorder, recurrent episode, moderate (HCC)  Obsessive-compulsive disorder, unspecified type  Panic attacks  PTSD (post-traumatic stress disorder)  Other orders -     sertraline (ZOLOFT) 100 MG tablet; Take 1 tablet (100 mg total) by mouth daily. -     ALPRAZolam (XANAX XR) 0.5 MG 24 hr tablet; Take 1 tablet (0.5 mg total) by mouth 2 (two) times daily.    Please see After Visit Summary for patient specific instructions.  No future appointments.  No orders of the defined types were placed in this encounter.     -------------------------------

## 2019-06-09 ENCOUNTER — Ambulatory Visit: Payer: BC Managed Care – PPO | Admitting: Nurse Practitioner

## 2019-06-10 ENCOUNTER — Ambulatory Visit: Payer: BC Managed Care – PPO | Admitting: Gastroenterology

## 2019-06-15 DIAGNOSIS — K648 Other hemorrhoids: Secondary | ICD-10-CM | POA: Diagnosis not present

## 2019-06-15 DIAGNOSIS — K2930 Chronic superficial gastritis without bleeding: Secondary | ICD-10-CM | POA: Diagnosis not present

## 2019-06-15 DIAGNOSIS — B9681 Helicobacter pylori [H. pylori] as the cause of diseases classified elsewhere: Secondary | ICD-10-CM | POA: Diagnosis not present

## 2019-06-15 DIAGNOSIS — D122 Benign neoplasm of ascending colon: Secondary | ICD-10-CM | POA: Diagnosis not present

## 2019-06-15 DIAGNOSIS — R1031 Right lower quadrant pain: Secondary | ICD-10-CM | POA: Diagnosis not present

## 2019-06-15 DIAGNOSIS — R1011 Right upper quadrant pain: Secondary | ICD-10-CM | POA: Diagnosis not present

## 2019-06-15 DIAGNOSIS — K2950 Unspecified chronic gastritis without bleeding: Secondary | ICD-10-CM | POA: Diagnosis not present

## 2019-06-15 DIAGNOSIS — K3189 Other diseases of stomach and duodenum: Secondary | ICD-10-CM | POA: Diagnosis not present

## 2019-07-20 ENCOUNTER — Other Ambulatory Visit: Payer: Self-pay | Admitting: Nurse Practitioner

## 2019-09-12 ENCOUNTER — Telehealth: Payer: BC Managed Care – PPO | Admitting: Family

## 2019-09-12 DIAGNOSIS — L231 Allergic contact dermatitis due to adhesives: Secondary | ICD-10-CM

## 2019-09-12 MED ORDER — PREDNISONE 10 MG (21) PO TBPK: ORAL_TABLET | ORAL | 0 refills | Status: DC

## 2019-09-12 NOTE — Progress Notes (Signed)
E Visit for Rash  We are sorry that you are not feeling well. Here is how we plan to help!  Based on what you shared with me it looks like you have contact dermatitis.  Contact dermatitis is a skin rash caused by something that touches the skin and causes irritation or inflammation.  Your skin may be red, swollen, dry, cracked, and itch.  The rash should go away in a few days but can last a few weeks.  If you get a rash, it's important to figure out what caused it so the irritant can be avoided in the future.   Prednisone 10 mg daily for 6 days (see taper instructions below)  Directions for 6 day taper: Day 1: 2 tablets before breakfast, 1 after both lunch & dinner and 2 at bedtime Day 2: 1 tab before breakfast, 1 after both lunch & dinner and 2 at bedtime Day 3: 1 tab at each meal & 1 at bedtime Day 4: 1 tab at breakfast, 1 at lunch, 1 at bedtime Day 5: 1 tab at breakfast & 1 tab at bedtime Day 6: 1 tab at breakfast    HOME CARE:   Take cool showers and avoid direct sunlight.  Apply cool compress or wet dressings.  Take a bath in an oatmeal bath.  Sprinkle content of one Aveeno packet under running faucet with comfortably warm water.  Bathe for 15-20 minutes, 1-2 times daily.  Pat dry with a towel. Do not rub the rash.  Use hydrocortisone cream.  Take an antihistamine like Benadryl for widespread rashes that itch.  The adult dose of Benadryl is 25-50 mg by mouth 4 times daily.  Caution:  This type of medication may cause sleepiness.  Do not drink alcohol, drive, or operate dangerous machinery while taking antihistamines.  Do not take these medications if you have prostate enlargement.  Read package instructions thoroughly on all medications that you take.  GET HELP RIGHT AWAY IF:   Symptoms don't go away after treatment.  Severe itching that persists.  If you rash spreads or swells.  If you rash begins to smell.  If it blisters and opens or develops a yellow-brown  crust.  You develop a fever.  You have a sore throat.  You become short of breath.  MAKE SURE YOU:  Understand these instructions. Will watch your condition. Will get help right away if you are not doing well or get worse.  Thank you for choosing an e-visit. Your e-visit answers were reviewed by a board certified advanced clinical practitioner to complete your personal care plan. Depending upon the condition, your plan could have included both over the counter or prescription medications. Please review your pharmacy choice. Be sure that the pharmacy you have chosen is open so that you can pick up your prescription now.  If there is a problem you may message your provider in MyChart to have the prescription routed to another pharmacy. Your safety is important to [place]. If you have drug allergies check your prescription carefully.  For the next 24 hours, you can use MyChart to ask questions about today's visit, request a non-urgent call back, or ask for a work or school excuse from your e-visit provider. You will get an email in the next two days asking about your experience. I hope that your e-visit has been valuable and will speed your recovery.    Greater than 5 minutes, yet less than 10 minutes of time have been spent researching, coordinating,  and implementing care for this patient today.  Thank you for the details you included in the comment boxes. Those details are very helpful in determining the best course of treatment for you and help [place] to provide the best care.

## 2019-09-14 DIAGNOSIS — L300 Nummular dermatitis: Secondary | ICD-10-CM | POA: Diagnosis not present

## 2019-10-04 DIAGNOSIS — Z1231 Encounter for screening mammogram for malignant neoplasm of breast: Secondary | ICD-10-CM | POA: Diagnosis not present

## 2019-10-07 DIAGNOSIS — R922 Inconclusive mammogram: Secondary | ICD-10-CM | POA: Diagnosis not present

## 2019-10-07 DIAGNOSIS — N6002 Solitary cyst of left breast: Secondary | ICD-10-CM | POA: Diagnosis not present

## 2019-10-07 LAB — HM MAMMOGRAPHY

## 2019-10-15 ENCOUNTER — Telehealth: Payer: Self-pay | Admitting: Family

## 2019-10-15 NOTE — Telephone Encounter (Signed)
Patient reports she was already back for additional images and [place].

## 2019-10-15 NOTE — Telephone Encounter (Signed)
Please contact pt and let hr know that I received a copy of her mammogram report. Please let pt know that the radiologist would like her to complete some additional breast images for further evaluation. Let me know if she has not been contacted by them about a follow up appointment in 1 week.

## 2019-11-07 ENCOUNTER — Telehealth: Payer: BC Managed Care – PPO | Admitting: Nurse Practitioner

## 2019-11-07 DIAGNOSIS — R21 Rash and other nonspecific skin eruption: Secondary | ICD-10-CM | POA: Diagnosis not present

## 2019-11-07 DIAGNOSIS — W57XXXA Bitten or stung by nonvenomous insect and other nonvenomous arthropods, initial encounter: Secondary | ICD-10-CM | POA: Diagnosis not present

## 2019-11-07 MED ORDER — DOXYCYCLINE HYCLATE 100 MG PO TABS: 100.0000 mg | ORAL_TABLET | Freq: Two times a day (BID) | ORAL | 0 refills | Status: DC

## 2019-11-07 NOTE — Progress Notes (Signed)
Thank you for describing your tick bite, Here is how we plan to help! Based on the information that you shared with me it looks like you have An infected or complicated tick bite that requires a longer course of antibiotics and will need for you to schedule a follow-up visit with a provider.  In most cases a tick bite is painless and does not itch.  Most tick bites in which the tick is quickly removed do not require prescriptions. Ticks can transmit several diseases if they are infected and remain attacked to your skin. Therefore the length that the tick was attached and any symptoms you have experienced after the bite are import to accurately develop your custom treatment plan. In most cases a single dose of doxycycline may prevent the development of a more serious condition.  Based on your information I have Provided a home care guide for tick bites and  instructions on when to call for help. and Your symptoms indicate that you need a longer course of antibiotics and a follow up visit with a provider. I have sent doxycycline 100 mg twice a day for 21 days to the pharmacy that you selected. You will need to schedule a follow up visit with your provider. If you do not have a primary care provider you may use our telehealth physicians on the web at [facility]   * you are allergic to red dye and doxycycline has ome red dye in it but thi I the only treatment for lyme disease ad [place] spotted fever.please ee you PCP if you tart having a reaction.  Which ticks  are associated with illness?  The Wood Tick (dog tick) is the size of a watermelon seed and can sometimes transmit [facility] spotted fever and [place] tick fever.   The Deer Tick (black-legged tick) is between the size of a poppy seed (pin head) and an apple seed, and can sometimes transmit Lyme disease.  A brown to black tick with a white splotch on its back is likely a female Amblyomma americanum ([place] tick). This  tick has been associated with Southern Tick Associated illness ( STARI)  Lyme disease has become the most common tick-borne illness in the [place]. The risk of Lyme disease following a recognized deer tick bite is estimated to be 1%.  The majority of cases of Lyme disease start with a bull's eye rash at the site of the tick bite. The rash can occur days to weeks (typically 7-10 days) after a tick bite. Treatment with antibiotics is indicated if this rash appears. Flu-like symptoms may accompany the rash, including: fever, chills, headaches, muscle aches, and fatigue. Removing ticks promptly may prevent tick borne disease.  What can be used to prevent Tick Bites?   Insect repellant with at leas 20% DEET.  Wearing long pants with sock and shoes.  Avoiding tall grass and heavily wooded areas.  Checking your skin after being outdoors.  Shower with a washcloth after outdoor exposures.  HOME CARE ADVICE FOR TICK BITE  1. Wood Tick Removal:  o Use a pair of tweezers and grasp the wood tick close to the skin (on its head). Pull the wood tick straight upward without twisting or crushing it. Maintain a steady pressure until it releases its grip.   o If tweezers aren't available, use fingers, a loop of thread around the jaws, or a needle between the jaws for traction.  o Note: covering the tick with petroleum jelly, nail polish  or rubbing alcohol doesn't work. Neither does touching the tick with a hot or cold object. 2. Tiny Deer Tick Removal:   o Needs to be scraped off with a knife blade or credit card edge. o Place tick in a sealed container (e.g. glass jar, zip lock plastic bag), in case your doctor wants to see it. 3. Tick's Head Removal:  o If the wood tick's head breaks off in the skin, it must be removed. Clean the skin. Then use a sterile needle to uncover the head and lift it out or scrape it off.  o If a very small piece of the head remains, the skin will eventually slough it  off. 4. Antibiotic Ointment:  o Wash the wound and your hands with soap and water after removal to prevent catching any tick disease.  Apply an over the counter antibiotic ointment (e.g. bacitracin) to the bite once. 5. Expected Course: Tick bites normally don't itch or hurt. That's why they often go unnoticed. 6. Call Your Doctor If:  o You can't remove the tick or the tick's head o Fever, a severe head ache, or rash occur in the next 2 weeks o Bite begins to look infected o Lyme's disease is common in your area o You have not had a tetanus in the last 10 years o Your current symptoms become worse    MAKE SURE YOU   Understand these instructions.  Will watch your condition.  Will get help right away if you are not doing well or get worse.   Thank you for choosing an e-visit.  Your e-visit answers were reviewed by a board certified advanced clinical practitioner to complete your personal care plan. Depending upon the condition, your plan could have included both over the counter or prescription medications. Please review your pharmacy choice. If there is a problem you may use MyChart messaging to have the prescription routed to another pharmacy. Your safety is important to [place]. If you have drug allergies check your prescription carefully.   You can use MyChart to ask questions about today's visit, request a non-urgent call back, or ask for a work or school excuse for 24 hours related to this e-Visit. If it has been greater than 24 hours you will need to follow up with your provider, or enter a new e-Visit to address those concerns.  You will get an email in the next two days asking about your experience. I hope  that your e-visit has been valuable and will speed your recovery  5-10 minutes spent reviewing and documenting in chart.

## 2019-12-28 DIAGNOSIS — M25512 Pain in left shoulder: Secondary | ICD-10-CM | POA: Diagnosis not present

## 2019-12-28 DIAGNOSIS — M7502 Adhesive capsulitis of left shoulder: Secondary | ICD-10-CM | POA: Diagnosis not present

## 2019-12-29 DIAGNOSIS — M7502 Adhesive capsulitis of left shoulder: Secondary | ICD-10-CM | POA: Insufficient documentation

## 2020-01-10 ENCOUNTER — Encounter: Payer: Self-pay | Admitting: Family

## 2020-04-13 DIAGNOSIS — L718 Other rosacea: Secondary | ICD-10-CM | POA: Diagnosis not present

## 2020-04-25 DIAGNOSIS — H66003 Acute suppurative otitis media without spontaneous rupture of ear drum, bilateral: Secondary | ICD-10-CM | POA: Diagnosis not present

## 2020-05-01 ENCOUNTER — Ambulatory Visit: Payer: BC Managed Care – PPO | Admitting: Family Medicine

## 2020-05-01 ENCOUNTER — Other Ambulatory Visit: Payer: Self-pay

## 2020-05-01 ENCOUNTER — Encounter: Payer: Self-pay | Admitting: Family Medicine

## 2020-05-01 VITALS — BP 132/88 | HR 58 | Temp 98.9°F | Ht 71.0 in | Wt 294.1 lb

## 2020-05-01 DIAGNOSIS — F411 Generalized anxiety disorder: Secondary | ICD-10-CM | POA: Diagnosis not present

## 2020-05-01 DIAGNOSIS — H6592 Unspecified nonsuppurative otitis media, left ear: Secondary | ICD-10-CM | POA: Diagnosis not present

## 2020-05-01 DIAGNOSIS — R42 Dizziness and giddiness: Secondary | ICD-10-CM

## 2020-05-01 MED ORDER — ONDANSETRON 4 MG PO TBDP: 4.0000 mg | ORAL_TABLET | Freq: Three times a day (TID) | ORAL | 0 refills | Status: DC | PRN

## 2020-05-01 MED ORDER — DIAZEPAM 10 MG PO TABS: 5.0000 mg | ORAL_TABLET | Freq: Two times a day (BID) | ORAL | 0 refills | Status: DC | PRN

## 2020-05-01 MED ORDER — PREDNISONE 20 MG PO TABS: 40.0000 mg | ORAL_TABLET | Freq: Every day | ORAL | 0 refills | Status: AC

## 2020-05-01 NOTE — Progress Notes (Signed)
Chief Complaint  Patient presents with  . Ear Pain  . Dizziness    Pt is here for bilateral ear pain, worse on the left. Duration: 6 days Progression: unchanged Associated symptoms: Dizziness, fullness, ringing in ear Denies: sore throat, congestion, post nasal drip, coryza, sneezing, sinus pressure and low grade fevers, bleeding, or discharge from ear Treatment to date: Augmentin, Flonase  Pt also has a hx of anxiety. She was seeing psych in past and on Xanax XR. She started having worsening anxiety since this started and feels quite tense. Not currently following with anyone at this time.  Past Medical History:  Diagnosis Date  . Anxiety    has panic attacks  . Depression   . OCD (obsessive compulsive disorder)   . PTSD (post-traumatic stress disorder)     BP 132/88 (BP Location: Left Arm, Patient Position: Sitting, Cuff Size: Large)   Pulse (!) 58   Temp 98.9 F (37.2 C) (Oral)   Ht 5\' 11"  (1.803 m)   Wt 294 lb 2 oz (133.4 kg)   SpO2 99%   BMI 41.02 kg/m  General: Awake, alert, appearing stated age HEENT:  L ear- Canal patent without drainage or erythema, TM is w serous fluid but otherwise unremarkbel R ear- canal patent without drainage or erythema, TM is slightly retracted without erythema, purulence or fluid Nose- nares patent and without discharge Mouth- Lips, gums and dentition unremarkable, pharynx is without erythema or exudate Neck: No adenopathy Heart: RRR Lungs: CTAB. Normal effort, no accessory muscle use Psych: Age appropriate judgment and insight, normal mood and affect  Fluid level behind tympanic membrane of left ear - Plan: predniSONE (DELTASONE) 20 MG tablet  GAD (generalized anxiety disorder) - Plan: diazepam (VALIUM) 10 MG tablet  Dizzy - Plan: ondansetron (ZOFRAN-ODT) 4 MG disintegrating tablet, diazepam (VALIUM) 10 MG tablet  1. Pred Burst, 40 mg/d for 5 d. 2. Valium 5-10 mg q 12 hrs prn anxiety/muscle spasms. Will have her f/u in 2 weeks for  this to make sure her anxiety does not need further management with getting her back in with psychiatry.  3. Zofran and Valium. Hopefully #1 tx will help with this, but she was also given Epley maneuver info.  Pt was told not to get pregnant on these meds.  Letter for work given excusing her until Fri or sooner if she is feeling better.  Pt voiced understanding and agreement to the plan.  Wed [place], DO 05/01/20 4:45 PM

## 2020-05-01 NOTE — Patient Instructions (Addendum)
Stop the antibiotic and Mucinex.  Try to drink 55-60 oz of water daily outside of exercise.   Let us know if you need anything.  How to Perform the Epley Maneuver The Epley maneuver is an exercise that relieves symptoms of vertigo. Vertigo is the feeling that you or your surroundings are moving when they are not. When you feel vertigo, you may feel like the room is spinning and have trouble walking. Dizziness is a little different than vertigo. When you are dizzy, you may feel unsteady or light-headed. You can do this maneuver at home whenever you have symptoms of vertigo. You can do it up to 3 times a day until your symptoms go away. Even though the Epley maneuver may relieve your vertigo for a few weeks, it is possible that your symptoms will return. This maneuver relieves vertigo, but it does not relieve dizziness. What are the risks? If it is done correctly, the Epley maneuver is considered safe. Sometimes it can lead to dizziness or nausea that goes away after a short time. If you develop other symptoms, such as changes in vision, weakness, or numbness, stop doing the maneuver and call your health care provider. How to perform the Epley maneuver 1. Sit on the edge of a bed or table with your back straight and your legs extended or hanging over the edge of the bed or table. 2. Turn your head halfway toward the affected ear or side. 3. Lie backward quickly with your head turned until you are lying flat on your back. You may want to position a pillow under your shoulders. 4. Hold this position for 30 seconds. You may experience an attack of vertigo. This is normal. 5. Turn your head to the opposite direction until your unaffected ear is facing the floor. 6. Hold this position for 30 seconds. You may experience an attack of vertigo. This is normal. Hold this position until the vertigo stops. 7. Turn your whole body to the same side as your head. Hold for another 30 seconds. 8. Sit back up. You  can repeat this exercise up to 3 times a day. Follow these instructions at home:  After doing the Epley maneuver, you can return to your normal activities.  Ask your health care provider if there is anything you should do at home to prevent vertigo. He or she may recommend that you: ? Keep your head raised (elevated) with two or more pillows while you sleep. ? Do not sleep on the side of your affected ear. ? Get up slowly from bed. ? Avoid sudden movements during the day. ? Avoid extreme head movement, like looking up or bending over. Contact a health care provider if:  Your vertigo gets worse.  You have other symptoms, including: ? Nausea. ? Vomiting. ? Headache. Get help right away if:  You have vision changes.  You have a severe or worsening headache or neck pain.  You cannot stop vomiting.  You have new numbness or weakness in any part of your body. Summary  Vertigo is the feeling that you or your surroundings are moving when they are not.  The Epley maneuver is an exercise that relieves symptoms of vertigo.  If the Epley maneuver is done correctly, it is considered safe. You can do it up to 3 times a day. This information is not intended to replace advice given to you by your health care provider. Make sure you discuss any questions you have with your health care provider. Document  Revised: 05/09/2017 Document Reviewed: 04/16/2016 Elsevier Patient Education  [place].

## 2020-05-08 ENCOUNTER — Other Ambulatory Visit: Payer: Self-pay | Admitting: Family Medicine

## 2020-05-08 MED ORDER — MECLIZINE HCL 25 MG PO TABS: 25.0000 mg | ORAL_TABLET | Freq: Three times a day (TID) | ORAL | 1 refills | Status: DC | PRN

## 2020-05-15 ENCOUNTER — Telehealth: Payer: Self-pay | Admitting: Family

## 2020-05-15 NOTE — Telephone Encounter (Signed)
Document faxed to our office to be filled out ( FMLA - 12 pages [facility]) Document put at front office tray under providers name.

## 2020-05-16 NOTE — Telephone Encounter (Signed)
Lvm for patient to call back in reference to her FMLA. If this is for vertigo, Dr. Carmelia Roller will evaluate,  If for something else she may need appointment with [facility] whom has not seen her since Oct 2020.

## 2020-05-16 NOTE — Telephone Encounter (Signed)
Patient needs FMLA filled out to cover her for the 3 days that she was out of work 11-22,23,24. She did go back to work on 11-26, she reports she is still having dizziness. She was scheduled for follow up 05-22-20. Form given to Zella Ball so Dr . Lacretia Nicks will fill out.

## 2020-05-22 ENCOUNTER — Encounter: Payer: Self-pay | Admitting: Family Medicine

## 2020-05-22 ENCOUNTER — Other Ambulatory Visit: Payer: Self-pay

## 2020-05-22 ENCOUNTER — Ambulatory Visit: Payer: BC Managed Care – PPO | Admitting: Family Medicine

## 2020-05-22 VITALS — BP 118/80 | HR 83 | Temp 98.6°F | Ht 71.0 in | Wt 296.0 lb

## 2020-05-22 DIAGNOSIS — R42 Dizziness and giddiness: Secondary | ICD-10-CM

## 2020-05-22 DIAGNOSIS — M542 Cervicalgia: Secondary | ICD-10-CM | POA: Diagnosis not present

## 2020-05-22 DIAGNOSIS — H6982 Other specified disorders of Eustachian tube, left ear: Secondary | ICD-10-CM | POA: Diagnosis not present

## 2020-05-22 MED ORDER — FLUTICASONE PROPIONATE 50 MCG/ACT NA SUSP: 2.0000 | Freq: Every day | NASAL | 6 refills | Status: DC

## 2020-05-22 MED ORDER — CYCLOBENZAPRINE HCL 10 MG PO TABS: 5.0000 mg | ORAL_TABLET | Freq: Three times a day (TID) | ORAL | 0 refills | Status: DC | PRN

## 2020-05-22 NOTE — Progress Notes (Signed)
Chief Complaint  Patient presents with  . Dizziness    Subjective: Patient is a 44 y.o. female here for f/u.  The patient was seen on 11/22 and diagnosed with a left ear effusion and vertigo.  She does not feel like there is fluid in her ear anymore but will still have intermittent crackling.  She has not been using anything for this at home.  She was diagnosed with vertigo as well.  Valium and the Epley maneuver has helped a little bit.  She does not use the Valium often. She will still have several seconds of being "off-balanced" if she moves too quickly.  She has had nearly 1 mo of L sided neck pain. No inj or change in activity. She does carry a lot of her stress in her neck. No neuro s/s's. An OT tried working on it but said she was too stiff. Unsure if the Valium helped relax those muscles.   Past Medical History:  Diagnosis Date  . Anxiety    has panic attacks  . Depression   . OCD (obsessive compulsive disorder)   . PTSD (post-traumatic stress disorder)     Objective: BP 118/80 (BP Location: Left Arm, Patient Position: Sitting, Cuff Size: Large)   Pulse 83   Temp 98.6 F (37 C) (Oral)   Ht 5\' 11"  (1.803 m)   Wt 296 lb (134.3 kg)   SpO2 100%   BMI 41.28 kg/m  General: Awake, appears stated age HEENT: MMM, EOMi. Ears neg b/l. Nares patent, no d/c. MSK: +TTP over lateral L neck. No midline or parasp msc ttp.  Lungs: CTAB, no rales, wheezes or rhonchi. No accessory muscle use Psych: Age appropriate judgment and insight, normal affect and mood  Assessment and Plan: Vertigo - Plan: Ambulatory referral to Physical Therapy  Dysfunction of left eustachian tube - Plan: fluticasone (FLONASE) 50 MCG/ACT nasal spray  Neck pain - Plan: cyclobenzaprine (FLEXERIL) 10 MG tablet  1.  I think this is improved but she is clearly still having issues.  Refer to the vestibular rehab team.  She did get some benefit from doing the Epley maneuver at home. 2.  The crackling in her ears is  likely some residual dysfunction of the eustachian tube.  As needed Flonase.  There is no fluid in her ear, canals are clear, and her TMs are unremarkable. 3.  She does have some significant neck pain.  Tylenol, heat, Flexeril, stretches/exercises.  If no improvement, will consider physical therapy for this as well. Her FMLA forms were not complete as there appeared to be a skin malfunction.  She will drop off a copy and I will fill it out excusing her for 3 days in November. The patient voiced understanding and agreement to the plan.  December [place], DO 05/22/20  11:59 AM

## 2020-05-22 NOTE — Patient Instructions (Addendum)
If you do not hear anything about your referral in the next 1-2 weeks, call our office and ask for an update.  Drop off the FMLA form for me to fill out.  Use Flonase as needed. This is available otc.   Take Flexeril (cyclobenzaprine) 1-2 hours before planned bedtime. If it makes you drowsy, do not take during the day. You can try half a tab the following night.  Heat (pad or rice pillow in microwave) over affected area, 10-15 minutes twice daily.   EXERCISES RANGE OF MOTION (ROM) AND STRETCHING EXERCISES  These exercises may help you when beginning to rehabilitate your issue. In order to successfully resolve your symptoms, you must improve your posture. These exercises are designed to help reduce the forward-head and rounded-shoulder posture which contributes to this condition. Your symptoms may resolve with or without further involvement from your physician, physical therapist or athletic trainer. While completing these exercises, remember:   Restoring tissue flexibility helps normal motion to return to the joints. This allows healthier, less painful movement and activity.  An effective stretch should be held for at least 20 seconds, although you may need to begin with shorter hold times for comfort.  A stretch should never be painful. You should only feel a gentle lengthening or release in the stretched tissue.  Do not do any stretch or exercise that you cannot tolerate.  STRETCH- Axial Extensors  Lie on your back on the floor. You may bend your knees for comfort. Place a rolled-up hand towel or dish towel, about 2 inches in diameter, under the part of your head that makes contact with the floor.  Gently tuck your chin, as if trying to make a "double chin," until you feel a gentle stretch at the base of your head.  Hold 15-20 seconds. Repeat 2-3 times. Complete this exercise 1 time per day.   STRETCH - Axial Extension   Stand or sit on a firm surface. Assume a good posture: chest  up, shoulders drawn back, abdominal muscles slightly tense, knees unlocked (if standing) and feet hip width apart.  Slowly retract your chin so your head slides back and your chin slightly lowers. Continue to look straight ahead.  You should feel a gentle stretch in the back of your head. Be certain not to feel an aggressive stretch since this can cause headaches later.  Hold for 15-20 seconds. Repeat 2-3 times. Complete this exercise 1 time per day.  STRETCH - Cervical Side Bend   Stand or sit on a firm surface. Assume a good posture: chest up, shoulders drawn back, abdominal muscles slightly tense, knees unlocked (if standing) and feet hip width apart.  Without letting your nose or shoulders move, slowly tip your right / left ear to your shoulder until your feel a gentle stretch in the muscles on the opposite side of your neck.  Hold 15-20 seconds. Repeat 2-3 times. Complete this exercise 1-2 times per day.  STRETCH - Cervical Rotators   Stand or sit on a firm surface. Assume a good posture: chest up, shoulders drawn back, abdominal muscles slightly tense, knees unlocked (if standing) and feet hip width apart.  Keeping your eyes level with the ground, slowly turn your head until you feel a gentle stretch along the back and opposite side of your neck.  Hold 15-20 seconds. Repeat 2-3 times. Complete this exercise 1-2 times per day.  RANGE OF MOTION - Neck Circles   Stand or sit on a firm surface. Assume a  good posture: chest up, shoulders drawn back, abdominal muscles slightly tense, knees unlocked (if standing) and feet hip width apart.  Gently roll your head down and around from the back of one shoulder to the back of the other. The motion should never be forced or painful.  Repeat the motion 10-20 times, or until you feel the neck muscles relax and loosen. Repeat 2-3 times. Complete the exercise 1-2 times per day. STRENGTHENING EXERCISES - Cervical Strain and Sprain These  exercises may help you when beginning to rehabilitate your injury. They may resolve your symptoms with or without further involvement from your physician, physical therapist, or athletic trainer. While completing these exercises, remember:   Muscles can gain both the endurance and the strength needed for everyday activities through controlled exercises.  Complete these exercises as instructed by your physician, physical therapist, or athletic trainer. Progress the resistance and repetitions only as guided.  You may experience muscle soreness or fatigue, but the pain or discomfort you are trying to eliminate should never worsen during these exercises. If this pain does worsen, stop and make certain you are following the directions exactly. If the pain is still present after adjustments, discontinue the exercise until you can discuss the trouble with your clinician.  STRENGTH - Cervical Flexors, Isometric  Face a wall, standing about 6 inches away. Place a small pillow, a ball about 6-8 inches in diameter, or a folded towel between your forehead and the wall.  Slightly tuck your chin and gently push your forehead into the soft object. Push only with mild to moderate intensity, building up tension gradually. Keep your jaw and forehead relaxed.  Hold 10 to 20 seconds. Keep your breathing relaxed.  Release the tension slowly. Relax your neck muscles completely before you start the next repetition. Repeat 2-3 times. Complete this exercise 1 time per day.  STRENGTH- Cervical Lateral Flexors, Isometric   Stand about 6 inches away from a wall. Place a small pillow, a ball about 6-8 inches in diameter, or a folded towel between the side of your head and the wall.  Slightly tuck your chin and gently tilt your head into the soft object. Push only with mild to moderate intensity, building up tension gradually. Keep your jaw and forehead relaxed.  Hold 10 to 20 seconds. Keep your breathing  relaxed.  Release the tension slowly. Relax your neck muscles completely before you start the next repetition. Repeat 2-3 times. Complete this exercise 1 time per day.  STRENGTH - Cervical Extensors, Isometric   Stand about 6 inches away from a wall. Place a small pillow, a ball about 6-8 inches in diameter, or a folded towel between the back of your head and the wall.  Slightly tuck your chin and gently tilt your head back into the soft object. Push only with mild to moderate intensity, building up tension gradually. Keep your jaw and forehead relaxed.  Hold 10 to 20 seconds. Keep your breathing relaxed.  Release the tension slowly. Relax your neck muscles completely before you start the next repetition. Repeat 2-3 times. Complete this exercise 1 time per day.  POSTURE AND BODY MECHANICS CONSIDERATIONS Keeping correct posture when sitting, standing or completing your activities will reduce the stress put on different body tissues, allowing injured tissues a chance to heal and limiting painful experiences. The following are general guidelines for improved posture. Your physician or physical therapist will provide you with any instructions specific to your needs. While reading these guidelines, remember:  The  exercises prescribed by your provider will help you have the flexibility and strength to maintain correct postures.  The correct posture provides the optimal environment for your joints to work. All of your joints have less wear and tear when properly supported by a spine with good posture. This means you will experience a healthier, less painful body.  Correct posture must be practiced with all of your activities, especially prolonged sitting and standing. Correct posture is as important when doing repetitive low-stress activities (typing) as it is when doing a single heavy-load activity (lifting).  PROLONGED STANDING WHILE SLIGHTLY LEANING FORWARD When completing a task that requires  you to lean forward while standing in one place for a long time, place either foot up on a stationary 2- to 4-inch high object to help maintain the best posture. When both feet are on the ground, the low back tends to lose its slight inward curve. If this curve flattens (or becomes too large), then the back and your other joints will experience too much stress, fatigue more quickly, and can cause pain.   RESTING POSITIONS Consider which positions are most painful for you when choosing a resting position. If you have pain with flexion-based activities (sitting, bending, stooping, squatting), choose a position that allows you to rest in a less flexed posture. You would want to avoid curling into a fetal position on your side. If your pain worsens with extension-based activities (prolonged standing, working overhead), avoid resting in an extended position such as sleeping on your stomach. Most people will find more comfort when they rest with their spine in a more neutral position, neither too rounded nor too arched. Lying on a non-sagging bed on your side with a pillow between your knees, or on your back with a pillow under your knees will often provide some relief. Keep in mind, being in any one position for a prolonged period of time, no matter how correct your posture, can still lead to stiffness.  WALKING Walk with an upright posture. Your ears, shoulders, and hips should all line up. OFFICE WORK When working at a desk, create an environment that supports good, upright posture. Without extra support, muscles fatigue and lead to excessive strain on joints and other tissues.  CHAIR:  A chair should be able to slide under your desk when your back makes contact with the back of the chair. This allows you to work closely.  The chair's height should allow your eyes to be level with the upper part of your monitor and your hands to be slightly lower than your elbows.  Body position: ? Your feet should  make contact with the floor. If this is not possible, use a foot rest. ? Keep your ears over your shoulders. This will reduce stress on your neck and low back.

## 2020-05-26 NOTE — Telephone Encounter (Signed)
Patient dropped off paperwork/Dr. Carmelia Roller completed. Called the patient to pickup at her convenience

## 2020-06-16 ENCOUNTER — Telehealth (INDEPENDENT_AMBULATORY_CARE_PROVIDER_SITE_OTHER): Payer: BC Managed Care – PPO | Admitting: Adult Health

## 2020-06-16 ENCOUNTER — Encounter: Payer: Self-pay | Admitting: Adult Health

## 2020-06-16 DIAGNOSIS — F411 Generalized anxiety disorder: Secondary | ICD-10-CM | POA: Diagnosis not present

## 2020-06-16 DIAGNOSIS — F4310 Post-traumatic stress disorder, unspecified: Secondary | ICD-10-CM

## 2020-06-16 DIAGNOSIS — F331 Major depressive disorder, recurrent, moderate: Secondary | ICD-10-CM

## 2020-06-16 DIAGNOSIS — F410 Panic disorder [episodic paroxysmal anxiety] without agoraphobia: Secondary | ICD-10-CM | POA: Diagnosis not present

## 2020-06-16 DIAGNOSIS — F429 Obsessive-compulsive disorder, unspecified: Secondary | ICD-10-CM

## 2020-06-16 MED ORDER — ALPRAZOLAM ER 0.5 MG PO TB24
0.5000 mg | ORAL_TABLET | Freq: Two times a day (BID) | ORAL | 2 refills | Status: DC
Start: 2020-06-16 — End: 2020-09-28

## 2020-06-16 MED ORDER — SERTRALINE HCL 100 MG PO TABS: 100.0000 mg | ORAL_TABLET | Freq: Every day | ORAL | 3 refills | Status: DC

## 2020-06-16 MED ORDER — ALPRAZOLAM 0.5 MG PO TABS
0.5000 mg | ORAL_TABLET | Freq: Every day | ORAL | 2 refills | Status: AC | PRN
Start: 2020-06-16 — End: ?

## 2020-06-16 NOTE — Progress Notes (Signed)
Rhonda Thompson [id number] 02-26-1976 45 y.o.  Virtual Visit via Video Note  I connected with pt @ on 06/16/20 at  8:00 AM EST by a video enabled telemedicine application and verified that I am speaking with the correct person using two identifiers.   I discussed the limitations of evaluation and management by telemedicine and the availability of in person appointments. The patient expressed understanding and agreed to proceed.  I discussed the assessment and treatment plan with the patient. The patient was provided an opportunity to ask questions and all were answered. The patient agreed with the plan and demonstrated an understanding of the instructions.   The patient was advised to call back or seek an in-person evaluation if the symptoms worsen or if the condition fails to improve as anticipated.  I provided  minutes of non-face-to-face time during this encounter.  The patient was located at home.  The provider was located at [place].   Aloha Gell, NP   Subjective:   Patient ID:  Rhonda Thompson is a 45 y.o. (DOB September 03, 1975) female.  Chief Complaint: No chief complaint on file.   HPI Revia Nghiem presents for follow-up of GAD, MDD, OCD, PTSD, and Panic attacks.  Describes mood today as "ok". Pleasant. Mood symptoms - reports depression, anxiety, and irritability. Increased panic attacks. Stating "I'm not doing well". Feels overwhelmed. Working 7 days a week - 12 hours a day. Increased stress in work setting. Outbreak in work setting. Having inner ear problems - constant ringing - working with PCP. Stable interest and motivation. Taking medications as prescribed.  Energy levels stable. Active, does not have a regular exercise routine. . Enjoys some usual interests and activities. Single. Lives with niece and dog. Spending time with family. Appetite adequate. Weight stable. Sleeping difficulties. Averages 4 hours. Focus and concentration stable. Completing tasks.  Managing aspects of household. Work stressful. Works full-time - 71 hours. Denies SI or HI.  Denies AH or VH.  Review of Systems:  Review of Systems  Musculoskeletal: Negative for gait problem.  Neurological: Negative for tremors.  Psychiatric/Behavioral:       Please refer to HPI    Medications: I have reviewed the patient's current medications.  Current Outpatient Medications  Medication Sig Dispense Refill  . ALPRAZolam (XANAX) 0.5 MG tablet Take 1 tablet (0.5 mg total) by mouth daily as needed for anxiety. 30 tablet 2  . acetaminophen (TYLENOL) 500 MG tablet Take 500 mg by mouth every 6 (six) hours as needed.    . ALPRAZolam (XANAX XR) 0.5 MG 24 hr tablet Take 1 tablet (0.5 mg total) by mouth 2 (two) times daily. 60 tablet 2  . cyclobenzaprine (FLEXERIL) 10 MG tablet Take 0.5-1 tablets (5-10 mg total) by mouth 3 (three) times daily as needed for muscle spasms. 21 tablet 0  . diazepam (VALIUM) 10 MG tablet Take 0.5-1 tablets (5-10 mg total) by mouth every 12 (twelve) hours as needed for anxiety. 15 tablet 0  . fluticasone (FLONASE) 50 MCG/ACT nasal spray Place 2 sprays into both nostrils daily. 16 g 6  . meclizine (ANTIVERT) 25 MG tablet Take 1 tablet (25 mg total) by mouth every 8 (eight) hours as needed for dizziness. 30 tablet 1  . omeprazole (PRILOSEC) 40 MG capsule TAKE 1 CAPSULE (40 MG TOTAL) BY MOUTH EVERY MORNING. TAKE 30-60 MINUTES BEFORE BREAKFAST. 90 capsule 1  . ondansetron (ZOFRAN-ODT) 4 MG disintegrating tablet Take 1 tablet (4 mg total) by mouth every 8 (eight) hours as needed for  nausea or vomiting. 20 tablet 0  . sertraline (ZOLOFT) 100 MG tablet Take 1 tablet (100 mg total) by mouth daily. 90 tablet 3   No current facility-administered medications for this visit.    Medication Side Effects: None  Allergies:  Allergies  Allergen Reactions  . Caffeine Anxiety and Other (See Comments)  . Iodinated Diagnostic Agents Swelling    Hives and itching  . Other Rash     Bandaids  . Red Dye Other (See Comments)    headache  . Nitrofurantoin Other (See Comments)    headache    Past Medical History:  Diagnosis Date  . Anxiety    has panic attacks  . Depression   . OCD (obsessive compulsive disorder)   . PTSD (post-traumatic stress disorder)     Family History  Problem Relation Age of Onset  . Hypertension Mother   . Diabetes Mother   . Cancer Mother        ovarian and kidney  . Depression Mother     Social History   Socioeconomic History  . Marital status: Single    Spouse name: Not on file  . Number of children: Not on file  . Years of education: Not on file  . Highest education level: Not on file  Occupational History  . Not on file  Tobacco Use  . Smoking status: Never Smoker  . Smokeless tobacco: Never Used  Substance and Sexual Activity  . Alcohol use: No    Alcohol/week: 0.0 standard drinks  . Drug use: Not on file  . Sexual activity: Not on file  Other Topics Concern  . Not on file  Social History Narrative   Lives with her mom   Lost a child 5-6   Raising her niece since she was born (she is age 68)   Works as a Architectural technologist for [employer]   2 pit bull rescues   Social Determinants of Corporate investment banker Strain: Not on Ship broker Insecurity: Not on file  Transportation Needs: Not on file  Physical Activity: Not on file  Stress: Not on file  Social Connections: Not on file  Intimate Partner Violence: Not on file    Past Medical History, Surgical history, Social history, and Family history were reviewed and updated as appropriate.   Please see review of systems for further details on the patient's review from today.   Objective:   Physical Exam:  There were no vitals taken for this visit.  Physical Exam Neurological:     Mental Status: She is alert and oriented to person, place, and time.     Cranial Nerves: No dysarthria.  Psychiatric:        Attention and Perception: Attention and  perception normal.        Mood and Affect: Mood normal.        Speech: Speech normal.        Behavior: Behavior is cooperative.        Thought Content: Thought content normal. Thought content is not paranoid or delusional. Thought content does not include homicidal or suicidal ideation. Thought content does not include homicidal or suicidal plan.        Cognition and Memory: Cognition and memory normal.        Judgment: Judgment normal.     Comments: Insight intact     Lab Review:     Component Value Date/Time   NA 138 04/07/2019 1435   K 4.4 04/07/2019 1435  CL 103 04/07/2019 1435   CO2 26 04/07/2019 1435   GLUCOSE 84 04/07/2019 1435   BUN 10 04/07/2019 1435   CREATININE 0.89 04/07/2019 1435   CALCIUM 9.1 04/07/2019 1435   PROT 7.0 04/07/2019 1435   ALBUMIN 4.4 04/07/2019 1435   AST 16 04/07/2019 1435   ALT 13 04/07/2019 1435   ALKPHOS 49 04/07/2019 1435   BILITOT 0.6 04/07/2019 1435       Component Value Date/Time   WBC 9.0 04/07/2019 1435   RBC 4.52 04/07/2019 1435   HGB 12.7 04/07/2019 1435   HCT 38.2 04/07/2019 1435   PLT 282.0 04/07/2019 1435   MCV 84.5 04/07/2019 1435   MCHC 33.2 04/07/2019 1435   RDW 14.6 04/07/2019 1435   LYMPHSABS 1.7 06/20/2017 0852   MONOABS 0.5 06/20/2017 0852   EOSABS 0.2 06/20/2017 0852   BASOSABS 0.1 06/20/2017 0852    No results found for: POCLITH, LITHIUM   No results found for: PHENYTOIN, PHENOBARB, VALPROATE, CBMZ   .res Assessment: Plan:    Plan:  1. Zoloft 50mg  daily x 7 - plans to go up to 100mg  and then holding until testing is completing. 2. Xanax XR 0.5mg  twice daily - will cal in 3 months for a refill. 3. Xanax 0.5mg  daily prn  RTC 6 months  Patient advised to contact office with any questions, adverse effects, or acute worsening in signs and symptoms.  Discussed potential benefits, risk, and side effects of benzodiazepines to include potential risk of tolerance and dependence, as well as possible drowsiness.   Advised patient not to drive if experiencing drowsiness and to take lowest possible effective dose to minimize risk of dependence and tolerance.   Diagnoses and all orders for this visit:  Panic attacks -     ALPRAZolam (XANAX XR) 0.5 MG 24 hr tablet; Take 1 tablet (0.5 mg total) by mouth 2 (two) times daily. -     ALPRAZolam (XANAX) 0.5 MG tablet; Take 1 tablet (0.5 mg total) by mouth daily as needed for anxiety.  Generalized anxiety disorder -     sertraline (ZOLOFT) 100 MG tablet; Take 1 tablet (100 mg total) by mouth daily.  PTSD (post-traumatic stress disorder) -     sertraline (ZOLOFT) 100 MG tablet; Take 1 tablet (100 mg total) by mouth daily.  Obsessive-compulsive disorder, unspecified type -     sertraline (ZOLOFT) 100 MG tablet; Take 1 tablet (100 mg total) by mouth daily.  Major depressive disorder, recurrent episode, moderate (HCC) -     sertraline (ZOLOFT) 100 MG tablet; Take 1 tablet (100 mg total) by mouth daily.     Please see After Visit Summary for patient specific instructions.  Future Appointments  Date Time Provider Department Center  06/19/2020  8:00 AM , PT [facility]    No orders of the defined types were placed in this encounter.     -------------------------------

## 2020-06-19 ENCOUNTER — Ambulatory Visit: Payer: BC Managed Care – PPO | Attending: Family Medicine | Admitting: Physical Therapy

## 2020-06-19 ENCOUNTER — Encounter: Payer: Self-pay | Admitting: Physical Therapy

## 2020-06-19 ENCOUNTER — Other Ambulatory Visit: Payer: Self-pay

## 2020-06-19 DIAGNOSIS — R29898 Other symptoms and signs involving the musculoskeletal system: Secondary | ICD-10-CM | POA: Diagnosis not present

## 2020-06-19 DIAGNOSIS — M542 Cervicalgia: Secondary | ICD-10-CM | POA: Diagnosis not present

## 2020-06-19 DIAGNOSIS — R2681 Unsteadiness on feet: Secondary | ICD-10-CM | POA: Insufficient documentation

## 2020-06-19 DIAGNOSIS — R42 Dizziness and giddiness: Secondary | ICD-10-CM | POA: Insufficient documentation

## 2020-06-19 NOTE — Therapy (Addendum)
[facility] [address]  [place], [place], [address] Phone: [phone]   Fax:  [phone]  Physical Therapy Evaluation  Patient Details  Name: Rhonda Thompson MRN: [id number] Date of Birth: 12/19/1975 Referring Provider (PT): Rockne Coons, DO   Encounter Date: 06/19/2020   PT End of Session - 06/19/20 0957    Visit Number 1    Number of Visits 9    Date for PT Re-Evaluation 08/14/20    Authorization Type BCBS    PT Start Time 0806    PT Stop Time 0848    PT Time Calculation (min) 42 min    Activity Tolerance Patient tolerated treatment well    Behavior During Therapy [facility] for tasks assessed/performed           Past Medical History:  Diagnosis Date  . Anxiety    has panic attacks  . Depression   . OCD (obsessive compulsive disorder)   . PTSD (post-traumatic stress disorder)     Past Surgical History:  Procedure Laterality Date  . CHOLECYSTECTOMY  06/10/13    There were no vitals filed for this visit.    Subjective Assessment - 06/19/20 0807    Subjective Patient reports 2 months of dizziness d/t fluid in her ears which was treated with meds. Still having ringing in her ears and feels like the sensation of pressure in her ears. Dizziness is worse in AM and feels like "I'm in water" or "swimmy." Also reports dizziness when moving her head to back out of a parking spot or to cross traffic. Episodes last 30 minutes. Better when looking at one thing and with rest. Denies hearing loss, otorrhea. Does note intermittent pain in the ears. Also noting increased neck and jaw tension d/t inability to relax, particularly on the L. 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 Noting that she is always dehydrated and recently had low BP readings.    Pertinent History PTSD, OCD, depression, anxiety    Limitations Reading;Lifting;Standing;Walking;House hold activities    Diagnostic tests none    Patient Stated Goals  decrease dizziness    Currently in Pain? Yes    Pain Score 8     Pain Location Neck    Pain Orientation Right;Left    Pain Descriptors / Indicators --   tension   Pain Type Acute pain    Pain Onset In the past 7 days    Aggravating Factors  in AM, had movements    Pain Relieving Factors strtetching              [facility] PT Assessment - 06/19/20 0815      Assessment   Medical Diagnosis Vertigo    Referring Provider (PT) Rockne Coons, DO    Onset Date/Surgical Date 06/05/20    Next MD Visit not scheduled    Prior Therapy yes- for vertigo      Precautions   Precautions None      Balance Screen   Has the patient fallen in the past 6 months No    Has the patient had a decrease in activity level because of a fear of falling?  No    Is the patient reluctant to leave their home because of a fear of falling?  No      Home Environment   Living Environment Private residence    Living Arrangements Other relatives   16y/o niece   Available Help at Discharge Family;Friend(s)  Type of [place] to enter    Entrance Stairs-Number of Steps 3    Entrance Stairs-Rails Right    Home Layout One level      Prior Function   Level of Independence Independent    Vocation Full time employment;Part time employment    Vocation Requirements computer work, lifting    Leisure none      Cognition   Overall Cognitive Status Within Functional Limits for tasks assessed      Sensation   Light Touch Appears Intact      Coordination   Gross Motor Movements are Fluid and Coordinated Yes      Posture/Postural Control   Posture/Postural Control Postural limitations    Postural Limitations Rounded Shoulders    Posture Comments B shoulders elevated      ROM / Strength   AROM / PROM / Strength AROM      AROM   AROM Assessment Site Cervical    Cervical Flexion 55    Cervical Extension 49    Cervical - Right Side Bend 40    Cervical - Left Side Bend 29    Cervical  - Right Rotation 62    Cervical - Left Rotation 65      Palpation   Palpation comment increased soft tissue restriction in B UT, TTP over B suboccipitals and R UT      Ambulation/Gait   Gait Pattern Within Functional Limits;Step-through pattern    Ambulation Surface Level;Indoor    Gait velocity WNL      Standardized Balance Assessment   Doctor, hospital Testing    Results M-CTSIB: EO/firm, EC/firm, EO/foam WNL, EC/foam- mild-moderate sway                  Vestibular Assessment - 06/19/20 0820      Oculomotor Exam   Oculomotor Alignment Normal    Ocular ROM WNL    Spontaneous Absent    Gaze-induced  Absent    Head shaking Horizontal Absent    Smooth Pursuits Intact    Saccades Intact      Vestibulo-Ocular Reflex   VOR 1 Head Only (x 1 viewing) horizontal VOR- 3/10 dizziness, vertical VOR- 5/10 dizziness    VOR Cancellation Normal              Objective measurements completed on examination: See above findings.               PT Education - 06/19/20 0957    Education Details prognosis, POC, HEP    Person(s) Educated Patient    Methods Explanation;Demonstration;Tactile cues;Verbal cues;Handout    Comprehension Returned demonstration;Verbalized understanding            PT Short Term Goals - 06/19/20 1009      PT SHORT TERM GOAL #1   Title Patient to be independent with initial HEP.    Time 3    Period Weeks    Status New    Target Date 07/10/20             PT Long Term Goals - 06/19/20 1009      PT LONG TERM GOAL #1   Title Patient to be independent with advanced HEP.    Time 8    Period Weeks    Status New    Target Date 08/14/20      PT LONG TERM GOAL #2  Title Patient to demonstrate cervical AROM WFL and without pain limiting.    Time 8    Period Weeks    Status New    Target Date 08/14/20      PT LONG TERM GOAL #3   Title Patient to score >19/24 on DGI in order  to decrease risk of falls.    Time 8    Period Weeks    Status New    Target Date 08/14/20      PT LONG TERM GOAL #4   Title Patient to report tolerance for driving without dizziness or neck pain limiting.    Time 8    Period Weeks    Status New    Target Date 08/14/20      PT LONG TERM GOAL #5   Title Patient to demonstrate 10x standing horizontal/vertical head turns with gaze fixation with 0/10 dizziness.    Time 8    Period Weeks    Status New    Target Date 08/14/20                  Plan - 06/19/20 1003    Clinical Impression Statement Patient is a 45y/o F presenting to OPPT with c/o dizziness for the past 2 months after being diagnosed with L ear effusion on 05/01/20. Dizziness lasts up to 30 minutes and is aggravated with head turns, worse in AM. Denies hearing loss or otorrhea. Does note intermittent pain in the ears. Also with c/o neck and jaw pain and stiffness, L>R. Patient reports a hx of L frozen shoulder and endorses chronic dehydration and recently low BP readings. Patient today presenting with B rounded and elevated shoulders, limited cervical AROM, increased soft tissue restriction in B UT and TTP over B suboccipitals and R UT, and mild instability with static balance. Oculomotor exam today revealed dizziness with horizontal and vertical VOR. Exam otherwise normal. Assessment today limited d/t time, plan to further assess balance and orthostatics next session. Patient was educated on VOR and postural correction HEP- patient reported understanding. Would benefit from skilled PT services 1x/week for 8 weeks to address aforementioned impairments.    Personal Factors and Comorbidities Age;Comorbidity 3+;Past/Current Experience;Profession;Time since onset of injury/illness/exacerbation    Comorbidities PTSD, OCD, depression, anxiety    Examination-Activity Limitations Bed Mobility;Squat;Bend;Stairs;Stand;Caring for  Others;Carry;Transfers;Dressing;Hygiene/Grooming;Lift;Locomotion Level;Reach Overhead    Examination-Participation Restrictions Church;Cleaning;Shop;Community Activity;Driving;Yard Work;Laundry;Meal Prep;Occupation    Stability/Clinical Decision Making Stable/Uncomplicated    Clinical Decision Making Low    Rehab Potential Good    PT Frequency 1x / week    PT Duration 8 weeks    PT Treatment/Interventions ADLs/Self Care Home Management;Canalith Repostioning;Cryotherapy;Electrical Stimulation;Moist Heat;Balance training;Therapeutic exercise;Therapeutic activities;Functional mobility training;Stair training;Gait training;Ultrasound;Neuromuscular re-education;Patient/family education;Manual techniques;Vestibular;Taping;Energy conservation;Dry needling;Passive range of motion    PT Next Visit Plan DGI, check orthostatics, progress cervical ROM and VOR training    Consulted and Agree with Plan of Care Patient           Patient will benefit from skilled therapeutic intervention in order to improve the following deficits and impairments:  Hypomobility,Decreased activity tolerance,Decreased strength,Increased fascial restricitons,Impaired UE functional use,Pain,Decreased balance,Increased muscle spasms,Improper body mechanics,Decreased range of motion,Dizziness,Postural dysfunction,Impaired flexibility  Visit Diagnosis: Dizziness and giddiness  Unsteadiness on feet  Cervicalgia  Other symptoms and signs involving the musculoskeletal system     Problem List Patient Active Problem List   Diagnosis Date Noted  . Adhesive capsulitis of left shoulder 12/29/2019  . Episodic mood disorder ([place]) 05/10/2013  . Obsessive-compulsive disorder, unspecified  02/01/2013  . Panic attacks 02/01/2013  . Anxiety 02/01/2013  . Lower urinary tract infectious disease 02/01/2013     Janene Harvey, PT, DPT 06/19/20 10:13 AM   [facility] [address]  [place], [place], [address] Phone: [phone]   Fax:  [phone]  Name: Rhonda Thompson MRN: [id number] Date of Birth: 1976-02-22   PHYSICAL THERAPY DISCHARGE SUMMARY  Visits from Start of Care: 1  Current functional level related to goals / functional outcomes: See above clinical impression; patient did not return   Remaining deficits: See above   Education / Equipment: HEP  Plan: Patient agrees to discharge.  Patient goals were not met. Patient is being discharged due to not returning since the last visit.  ?????     Janene Harvey, PT, DPT 07/27/20 1:54 PM

## 2020-07-06 ENCOUNTER — Ambulatory Visit (INDEPENDENT_AMBULATORY_CARE_PROVIDER_SITE_OTHER): Payer: BC Managed Care – PPO | Admitting: Family Medicine

## 2020-07-06 ENCOUNTER — Encounter: Payer: Self-pay | Admitting: Family Medicine

## 2020-07-06 ENCOUNTER — Other Ambulatory Visit: Payer: Self-pay

## 2020-07-06 VITALS — BP 142/88 | HR 88 | Temp 98.0°F | Resp 12

## 2020-07-06 DIAGNOSIS — R002 Palpitations: Secondary | ICD-10-CM | POA: Diagnosis not present

## 2020-07-06 DIAGNOSIS — R519 Headache, unspecified: Secondary | ICD-10-CM

## 2020-07-06 DIAGNOSIS — M542 Cervicalgia: Secondary | ICD-10-CM

## 2020-07-06 DIAGNOSIS — R0602 Shortness of breath: Secondary | ICD-10-CM | POA: Diagnosis not present

## 2020-07-06 DIAGNOSIS — H6982 Other specified disorders of Eustachian tube, left ear: Secondary | ICD-10-CM

## 2020-07-06 DIAGNOSIS — J329 Chronic sinusitis, unspecified: Secondary | ICD-10-CM

## 2020-07-06 DIAGNOSIS — F419 Anxiety disorder, unspecified: Secondary | ICD-10-CM | POA: Diagnosis not present

## 2020-07-06 LAB — CBC
HCT: 39.7 % (ref 36.0–46.0)
Hemoglobin: 12.9 g/dL (ref 12.0–15.0)
MCHC: 32.5 g/dL (ref 30.0–36.0)
MCV: 84.2 fl (ref 78.0–100.0)
Platelets: 267 10*3/uL (ref 150.0–400.0)
RBC: 4.72 Mil/uL (ref 3.87–5.11)
RDW: 14.5 % (ref 11.5–15.5)
WBC: 8.5 10*3/uL (ref 4.0–10.5)

## 2020-07-06 LAB — SEDIMENTATION RATE: Sed Rate: 61 mm/hr — ABNORMAL HIGH (ref 0–20)

## 2020-07-06 LAB — COMPREHENSIVE METABOLIC PANEL
ALT: 12 U/L (ref 0–35)
AST: 12 U/L (ref 0–37)
Albumin: 4.3 g/dL (ref 3.5–5.2)
Alkaline Phosphatase: 52 U/L (ref 39–117)
BUN: 11 mg/dL (ref 6–23)
CO2: 29 mEq/L (ref 19–32)
Calcium: 9.2 mg/dL (ref 8.4–10.5)
Chloride: 105 mEq/L (ref 96–112)
Creatinine, Ser: 1.15 mg/dL (ref 0.40–1.20)
GFR: 58.57 mL/min — ABNORMAL LOW (ref >60.00)
Glucose, Bld: 88 mg/dL (ref 70–99)
Potassium: 4.1 mEq/L (ref 3.5–5.1)
Sodium: 139 mEq/L (ref 135–145)
Total Bilirubin: 0.5 mg/dL (ref 0.2–1.2)
Total Protein: 7.1 g/dL (ref 6.0–8.3)

## 2020-07-06 LAB — TSH: TSH: 2.04 u[IU]/mL (ref 0.35–4.50)

## 2020-07-06 MED ORDER — FLUTICASONE PROPIONATE 50 MCG/ACT NA SUSP: 2.0000 | Freq: Every day | NASAL | 6 refills | Status: DC

## 2020-07-06 MED ORDER — METHYLPREDNISOLONE 4 MG PO TABS: ORAL_TABLET | ORAL | 0 refills | Status: DC

## 2020-07-06 MED ORDER — CETIRIZINE HCL 10 MG PO TABS: 10.0000 mg | ORAL_TABLET | Freq: Two times a day (BID) | ORAL | 11 refills | Status: DC

## 2020-07-06 MED ORDER — CEFDINIR 300 MG PO CAPS: 300.0000 mg | ORAL_CAPSULE | Freq: Two times a day (BID) | ORAL | 0 refills | Status: AC

## 2020-07-06 NOTE — Patient Instructions (Signed)
Nasal saline twice a day Sinusitis, Adult Sinusitis is inflammation of your sinuses. Sinuses are hollow spaces in the bones around your face. Your sinuses are located:  Around your eyes.  In the middle of your forehead.  Behind your nose.  In your cheekbones. Mucus normally drains out of your sinuses. When your nasal tissues become inflamed or swollen, mucus can become trapped or blocked. This allows bacteria, viruses, and fungi to grow, which leads to infection. Most infections of the sinuses are caused by a virus. Sinusitis can develop quickly. It can last for up to 4 weeks (acute) or for more than 12 weeks (chronic). Sinusitis often develops after a cold. What are the causes? This condition is caused by anything that creates swelling in the sinuses or stops mucus from draining. This includes:  Allergies.  Asthma.  Infection from bacteria or viruses.  Deformities or blockages in your nose or sinuses.  Abnormal growths in the nose (nasal polyps).  Pollutants, such as chemicals or irritants in the air.  Infection from fungi (rare). What increases the risk? You are more likely to develop this condition if you:  Have a weak body defense system (immune system).  Do a lot of swimming or diving.  Overuse nasal sprays.  Smoke. What are the signs or symptoms? The main symptoms of this condition are pain and a feeling of pressure around the affected sinuses. Other symptoms include:  Stuffy nose or congestion.  Thick drainage from your nose.  Swelling and warmth over the affected sinuses.  Headache.  Upper toothache.  A cough that may get worse at night.  Extra mucus that collects in the throat or the back of the nose (postnasal drip).  Decreased sense of smell and taste.  Fatigue.  A fever.  Sore throat.  Bad breath. How is this diagnosed? This condition is diagnosed based on:  Your symptoms.  Your medical history.  A physical exam.  Tests to find out  if your condition is acute or chronic. This may include: ? Checking your nose for nasal polyps. ? Viewing your sinuses using a device that has a light (endoscope). ? Testing for allergies or bacteria. ? Imaging tests, such as an MRI or CT scan. In rare cases, a bone biopsy may be done to rule out more serious types of fungal sinus disease. How is this treated? Treatment for sinusitis depends on the cause and whether your condition is chronic or acute.  If caused by a virus, your symptoms should go away on their own within 10 days. You may be given medicines to relieve symptoms. They include: ? Medicines that shrink swollen nasal passages (topical intranasal decongestants). ? Medicines that treat allergies (antihistamines). ? A spray that eases inflammation of the nostrils (topical intranasal corticosteroids). ? Rinses that help get rid of thick mucus in your nose (nasal saline washes).  If caused by bacteria, your health care provider may recommend waiting to see if your symptoms improve. Most bacterial infections will get better without antibiotic medicine. You may be given antibiotics if you have: ? A severe infection. ? A weak immune system.  If caused by narrow nasal passages or nasal polyps, you may need to have surgery. Follow these instructions at home: Medicines  Take, use, or apply over-the-counter and prescription medicines only as told by your health care provider. These may include nasal sprays.  If you were prescribed an antibiotic medicine, take it as told by your health care provider. Do not stop taking the  antibiotic even if you start to feel better. Hydrate and humidify  Drink enough fluid to keep your urine pale yellow. Staying hydrated will help to thin your mucus.  Use a cool mist humidifier to keep the humidity level in your home above 50%.  Inhale steam for 10-15 minutes, 3-4 times a day, or as told by your health care provider. You can do this in the bathroom  while a hot shower is running.  Limit your exposure to cool or dry air.   Rest  Rest as much as possible.  Sleep with your head raised (elevated).  Make sure you get enough sleep each night. General instructions  Apply a warm, moist washcloth to your face 3-4 times a day or as told by your health care provider. This will help with discomfort.  Wash your hands often with soap and water to reduce your exposure to germs. If soap and water are not available, use hand sanitizer.  Do not smoke. Avoid being around people who are smoking (secondhand smoke).  Keep all follow-up visits as told by your health care provider. This is important.   Contact a health care provider if:  You have a fever.  Your symptoms get worse.  Your symptoms do not improve within 10 days. Get help right away if:  You have a severe headache.  You have persistent vomiting.  You have severe pain or swelling around your face or eyes.  You have vision problems.  You develop confusion.  Your neck is stiff.  You have trouble breathing. Summary  Sinusitis is soreness and inflammation of your sinuses. Sinuses are hollow spaces in the bones around your face.  This condition is caused by nasal tissues that become inflamed or swollen. The swelling traps or blocks the flow of mucus. This allows bacteria, viruses, and fungi to grow, which leads to infection.  If you were prescribed an antibiotic medicine, take it as told by your health care provider. Do not stop taking the antibiotic even if you start to feel better.  Keep all follow-up visits as told by your health care provider. This is important. This information is not intended to replace advice given to you by your health care provider. Make sure you discuss any questions you have with your health care provider. Document Revised: 10/27/2017 Document Reviewed: 10/27/2017 Elsevier Patient Education  2021 [employer].

## 2020-07-07 DIAGNOSIS — M542 Cervicalgia: Secondary | ICD-10-CM | POA: Insufficient documentation

## 2020-07-07 DIAGNOSIS — J329 Chronic sinusitis, unspecified: Secondary | ICD-10-CM | POA: Insufficient documentation

## 2020-07-07 NOTE — Assessment & Plan Note (Addendum)
Encouraged moist heat and gentle stretching as tolerated. May try NSAIDs and prescription meds as directed and report if symptoms worsen or seek immediate care. Try topical creams and massage and/or chiropractic

## 2020-07-07 NOTE — Progress Notes (Signed)
Subjective:    Patient ID: Rhonda Thompson, female    DOB: September 25, 1975, 45 y.o.   MRN: [id number]  Chief Complaint  Patient presents with  . Tinnitus    Follow up   . Dizziness  . Nasal Congestion  . Headache    HPI Patient is in today for follow up on chronic medical concerns. No recent febrile illness or hospitalizations. She is struggling with persistent symptoms for over a month now. She has been struggling with symptoms for past month or so. She has noted facial pressure, pain and pressure behind her ears, fullness and crackling, popping in ears, dizziness especially with rapid movements. No change in hearing, she notes nasal congestion productive of sputum and symptoms are worse on the left side of her head. No fevers or chills. She further notes increased sneezing and that her symptoms improved some on a course of steroids only to worsen again. Denies CP/palp/SOB/fevers/GI or GU c/o. Taking meds as prescribed  Past Medical History:  Diagnosis Date  . Anxiety    has panic attacks  . Depression   . OCD (obsessive compulsive disorder)   . PTSD (post-traumatic stress disorder)     Past Surgical History:  Procedure Laterality Date  . CHOLECYSTECTOMY  06/10/13    Family History  Problem Relation Age of Onset  . Hypertension Mother   . Diabetes Mother   . Cancer Mother        ovarian and kidney  . Depression Mother     Social History   Socioeconomic History  . Marital status: Single    Spouse name: Not on file  . Number of children: Not on file  . Years of education: Not on file  . Highest education level: Not on file  Occupational History  . Not on file  Tobacco Use  . Smoking status: Never Smoker  . Smokeless tobacco: Never Used  Substance and Sexual Activity  . Alcohol use: No    Alcohol/week: 0.0 standard drinks  . Drug use: Not on file  . Sexual activity: Not on file  Other Topics Concern  . Not on file  Social History Narrative   Lives with her mom    Lost a child 5-6   Raising her niece since she was born (she is age 75)   Works as a Architectural technologist for [employer]   2 pit bull rescues   Social Determinants of Corporate investment banker Strain: Not on Ship broker Insecurity: Not on file  Transportation Needs: Not on file  Physical Activity: Not on file  Stress: Not on file  Social Connections: Not on file  Intimate Partner Violence: Not on file    Outpatient Medications Prior to Visit  Medication Sig Dispense Refill  . acetaminophen (TYLENOL) 500 MG tablet Take 500 mg by mouth every 6 (six) hours as needed.    . ALPRAZolam (XANAX XR) 0.5 MG 24 hr tablet Take 1 tablet (0.5 mg total) by mouth 2 (two) times daily. 60 tablet 2  . ALPRAZolam (XANAX) 0.5 MG tablet Take 1 tablet (0.5 mg total) by mouth daily as needed for anxiety. 30 tablet 2  . ondansetron (ZOFRAN-ODT) 4 MG disintegrating tablet Take 1 tablet (4 mg total) by mouth every 8 (eight) hours as needed for nausea or vomiting. 20 tablet 0  . sertraline (ZOLOFT) 100 MG tablet Take 1 tablet (100 mg total) by mouth daily. (Patient not taking: Reported on 07/06/2020) 90 tablet 3  . cyclobenzaprine (FLEXERIL)  10 MG tablet Take 0.5-1 tablets (5-10 mg total) by mouth 3 (three) times daily as needed for muscle spasms. (Patient not taking: No sig reported) 21 tablet 0  . diazepam (VALIUM) 10 MG tablet Take 0.5-1 tablets (5-10 mg total) by mouth every 12 (twelve) hours as needed for anxiety. 15 tablet 0  . fluticasone (FLONASE) 50 MCG/ACT nasal spray Place 2 sprays into both nostrils daily. 16 g 6  . meclizine (ANTIVERT) 25 MG tablet Take 1 tablet (25 mg total) by mouth every 8 (eight) hours as needed for dizziness. 30 tablet 1  . omeprazole (PRILOSEC) 40 MG capsule TAKE 1 CAPSULE (40 MG TOTAL) BY MOUTH EVERY MORNING. TAKE 30-60 MINUTES BEFORE BREAKFAST. (Patient not taking: Reported on 06/19/2020) 90 capsule 1   No facility-administered medications prior to visit.    Allergies  Allergen  Reactions  . Caffeine Anxiety and Other (See Comments)  . Iodinated Diagnostic Agents Swelling    Hives and itching  . Other Rash    Bandaids  . Red Dye Other (See Comments)    headache  . Nitrofurantoin Other (See Comments)    headache    Review of Systems  Constitutional: Positive for malaise/fatigue. Negative for chills and fever.  HENT: Positive for congestion, ear pain, sinus pain and tinnitus. Negative for ear discharge and nosebleeds.   Eyes: Negative for blurred vision.  Respiratory: Negative for sputum production and shortness of breath.   Cardiovascular: Negative for chest pain, palpitations and leg swelling.  Gastrointestinal: Negative for abdominal pain, blood in stool and nausea.  Genitourinary: Negative for dysuria and frequency.  Musculoskeletal: Positive for myalgias and neck pain. Negative for falls.  Skin: Negative for rash.  Neurological: Negative for dizziness, loss of consciousness and headaches.  Endo/Heme/Allergies: Negative for environmental allergies.  Psychiatric/Behavioral: Negative for depression. The patient is not nervous/anxious.        Objective:    Physical Exam Vitals and nursing note reviewed.  Constitutional:      General: She is not in acute distress.    Appearance: She is well-developed and well-nourished.  HENT:     Head: Normocephalic and atraumatic.     Nose: Nose normal.  Eyes:     General:        Right eye: No discharge.        Left eye: No discharge.  Neck:     Comments: Muscle spasm noted over bilateral SCM muscles Cardiovascular:     Rate and Rhythm: Normal rate and regular rhythm.     Heart sounds: No murmur heard.   Pulmonary:     Effort: Pulmonary effort is normal.     Breath sounds: Normal breath sounds.  Abdominal:     General: Bowel sounds are normal.     Palpations: Abdomen is soft.     Tenderness: There is no abdominal tenderness.  Musculoskeletal:        General: No edema.     Cervical back: Normal range  of motion and neck supple.  Skin:    General: Skin is warm and dry.  Neurological:     Mental Status: She is alert and oriented to person, place, and time.  Psychiatric:        Mood and Affect: Mood and affect normal.     BP (!) 142/88 (BP Location: Left Arm, Cuff Size: Large)   Pulse 88   Temp 98 F (36.7 C) (Oral)   Resp 12   LMP 06/22/2020   SpO2 100%  Wt  Readings from Last 3 Encounters:  05/22/20 296 lb (134.3 kg)  05/01/20 294 lb 2 oz (133.4 kg)  04/27/19 286 lb (129.7 kg)    Diabetic Foot Exam - Simple   No data filed    Lab Results  Component Value Date   WBC 8.5 07/06/2020   HGB 12.9 07/06/2020   HCT 39.7 07/06/2020   PLT 267.0 07/06/2020   GLUCOSE 88 07/06/2020   ALT 12 07/06/2020   AST 12 07/06/2020   NA 139 07/06/2020   K 4.1 07/06/2020   CL 105 07/06/2020   CREATININE 1.15 07/06/2020   BUN 11 07/06/2020   CO2 29 07/06/2020   TSH 2.04 07/06/2020    Lab Results  Component Value Date   TSH 2.04 07/06/2020   Lab Results  Component Value Date   WBC 8.5 07/06/2020   HGB 12.9 07/06/2020   HCT 39.7 07/06/2020   MCV 84.2 07/06/2020   PLT 267.0 07/06/2020   Lab Results  Component Value Date   NA 139 07/06/2020   K 4.1 07/06/2020   CO2 29 07/06/2020   GLUCOSE 88 07/06/2020   BUN 11 07/06/2020   CREATININE 1.15 07/06/2020   BILITOT 0.5 07/06/2020   ALKPHOS 52 07/06/2020   AST 12 07/06/2020   ALT 12 07/06/2020   PROT 7.1 07/06/2020   ALBUMIN 4.3 07/06/2020   CALCIUM 9.2 07/06/2020   GFR 58.57 (L) 07/06/2020   No results found for: CHOL No results found for: HDL No results found for: LDLCALC No results found for: TRIG No results found for: CHOLHDL No results found for: DVVO1Y     Assessment & Plan:   Problem List Items Addressed This Visit    Anxiety    She notes her anxiety is worsened this month with her illness.      Sinusitis    She has been struggling with symptoms for past month or so. She has noted facial pressure, pain  and pressure behind her ears, fullness and crackling, popping in ears, dizziness especially with rapid movements. No change in hearing, she notes nasal congestion productive of sputum and symptoms are worse on the left side of her head. No fevers or chills. She further notes increased sneezing and that her symptoms improved some on a course of steroids only to worsen again. Will start her on Cefdinir and medrol dose pak awa flonase and cetirizine and nasal saline. Encouraged a daily probiotic and bid Mucinex. If no improvement over next 1-2 weeks she will need imaging and referral to ENT. Spent 25 minutes with patient in discussing history PE and planning care.       Relevant Medications   methylPREDNISolone (MEDROL) 4 MG tablet   cetirizine (ZYRTEC) 10 MG tablet   cefdinir (OMNICEF) 300 MG capsule   fluticasone (FLONASE) 50 MCG/ACT nasal spray   Neck pain    Encouraged moist heat and gentle stretching as tolerated. May try NSAIDs and prescription meds as directed and report if symptoms worsen or seek immediate care. Try topical creams and massage and/or chiropractic       Other Visit Diagnoses    Palpitation    -  Primary   Relevant Orders   CBC (Completed)   Comprehensive metabolic panel (Completed)   TSH (Completed)   Sedimentation rate (Completed)   SOB (shortness of breath)       Relevant Orders   CBC (Completed)   Comprehensive metabolic panel (Completed)   TSH (Completed)   Sedimentation rate (Completed)  Nonintractable headache, unspecified chronicity pattern, unspecified headache type       Dysfunction of left eustachian tube       Relevant Medications   fluticasone (FLONASE) 50 MCG/ACT nasal spray      I have discontinued French Ana Shontz's omeprazole, diazepam, meclizine, and cyclobenzaprine. I am also having her start on methylPREDNISolone, cetirizine, and cefdinir. Additionally, I am having her maintain her acetaminophen, ondansetron, sertraline, ALPRAZolam, ALPRAZolam, and  fluticasone.  Meds ordered this encounter  Medications  . methylPREDNISolone (MEDROL) 4 MG tablet    Sig: 5 tab po qd X 1d then 4 tab po qd X 1d then 3 tab po qd X 1d then 2 tab po qd then 1 tab po qd    Dispense:  15 tablet    Refill:  0  . cetirizine (ZYRTEC) 10 MG tablet    Sig: Take 1 tablet (10 mg total) by mouth 2 (two) times daily.    Dispense:  30 tablet    Refill:  11  . cefdinir (OMNICEF) 300 MG capsule    Sig: Take 1 capsule (300 mg total) by mouth 2 (two) times daily for 10 days.    Dispense:  20 capsule    Refill:  0  . fluticasone (FLONASE) 50 MCG/ACT nasal spray    Sig: Place 2 sprays into both nostrils daily.    Dispense:  16 g    Refill:  6     Danise Edge, MD

## 2020-07-07 NOTE — Assessment & Plan Note (Signed)
She has been struggling with symptoms for past month or so. She has noted facial pressure, pain and pressure behind her ears, fullness and crackling, popping in ears, dizziness especially with rapid movements. No change in hearing, she notes nasal congestion productive of sputum and symptoms are worse on the left side of her head. No fevers or chills. She further notes increased sneezing and that her symptoms improved some on a course of steroids only to worsen again. Will start her on Cefdinir and medrol dose pak awa flonase and cetirizine and nasal saline. Encouraged a daily probiotic and bid Mucinex. If no improvement over next 1-2 weeks she will need imaging and referral to ENT. Spent 25 minutes with patient in discussing history PE and planning care.

## 2020-07-07 NOTE — Assessment & Plan Note (Signed)
She notes her anxiety is worsened this month with her illness.

## 2020-07-21 ENCOUNTER — Other Ambulatory Visit: Payer: Self-pay | Admitting: Family Medicine

## 2020-07-21 DIAGNOSIS — R42 Dizziness and giddiness: Secondary | ICD-10-CM

## 2020-08-22 ENCOUNTER — Telehealth (INDEPENDENT_AMBULATORY_CARE_PROVIDER_SITE_OTHER): Payer: BC Managed Care – PPO | Admitting: Family Medicine

## 2020-08-22 ENCOUNTER — Other Ambulatory Visit: Payer: Self-pay

## 2020-08-22 DIAGNOSIS — F419 Anxiety disorder, unspecified: Secondary | ICD-10-CM

## 2020-08-22 NOTE — Progress Notes (Addendum)
Virtual Visit via Video Note  I connected with Rhonda Thompson on 08/22/20 at  3:00 PM EDT by a video enabled telemedicine application and verified that I am speaking with the correct person using two identifiers.  Location: Patient: Home, patient and provider in visit Provider: Office   I discussed the limitations of evaluation and management by telemedicine and the availability of in person appointments. The patient expressed understanding and agreed to proceed. S Chism CMA was able to get the patient set up on video visit.      Subjective:    Patient ID: Rhonda Thompson, female    DOB: [birth year], 45 y.o.   MRN: [id number]  No chief complaint on file.   HPI Patient is in today for follow up on her anxiety. She notes she had been doing some better since her last visit until the last week or so when her symptoms flared again. She notes anxiety, fatigue, racing thoughts, nightmares, iinsomnia. No recent rebrile illness or hospitalizations.Denies CP/palp/SOB/HA/congestion/fevers/GI or GU c/o. Taking meds as prescribed  Past Medical History:  Diagnosis Date  . Anxiety    has panic attacks  . Depression   . OCD (obsessive compulsive disorder)   . PTSD (post-traumatic stress disorder)     Past Surgical History:  Procedure Laterality Date  . CHOLECYSTECTOMY  06/10/13    Family History  Problem Relation Age of Onset  . Hypertension Mother   . Diabetes Mother   . Cancer Mother        ovarian and kidney  . Depression Mother     Social History   Socioeconomic History  . Marital status: Single    Spouse name: Not on file  . Number of children: Not on file  . Years of education: Not on file  . Highest education level: Not on file  Occupational History  . Not on file  Tobacco Use  . Smoking status: Never Smoker  . Smokeless tobacco: Never Used  Substance and Sexual Activity  . Alcohol use: No    Alcohol/week: 0.0 standard drinks  . Drug use: Not on file  . Sexual activity: Not  on file  Other Topics Concern  . Not on file  Social History Narrative   Lives with her mom   Lost a child 5-6   Raising her niece since she was born (she is age 81)   Works as a Architectural technologist for [employer]   2 pit bull rescues   Social Determinants of Corporate investment banker Strain: Not on Ship broker Insecurity: Not on file  Transportation Needs: Not on file  Physical Activity: Not on file  Stress: Not on file  Social Connections: Not on file  Intimate Partner Violence: Not on file    Outpatient Medications Prior to Visit  Medication Sig Dispense Refill  . acetaminophen (TYLENOL) 500 MG tablet Take 500 mg by mouth every 6 (six) hours as needed.    . ALPRAZolam (XANAX XR) 0.5 MG 24 hr tablet Take 1 tablet (0.5 mg total) by mouth 2 (two) times daily. 60 tablet 2  . ALPRAZolam (XANAX) 0.5 MG tablet Take 1 tablet (0.5 mg total) by mouth daily as needed for anxiety. 30 tablet 2  . cetirizine (ZYRTEC) 10 MG tablet Take 1 tablet (10 mg total) by mouth 2 (two) times daily. 30 tablet 11  . fluticasone (FLONASE) 50 MCG/ACT nasal spray Place 2 sprays into both nostrils daily. 16 g 6  . methylPREDNISolone (MEDROL) 4 MG tablet  5 tab po qd X 1d then 4 tab po qd X 1d then 3 tab po qd X 1d then 2 tab po qd then 1 tab po qd 15 tablet 0  . ondansetron (ZOFRAN-ODT) 4 MG disintegrating tablet TAKE 1 TABLET BY MOUTH EVERY 8 HOURS AS NEEDED FOR NAUSEA AND VOMITING 18 tablet 1  . sertraline (ZOLOFT) 100 MG tablet Take 1 tablet (100 mg total) by mouth daily. (Patient not taking: Reported on 07/06/2020) 90 tablet 3   No facility-administered medications prior to visit.    Allergies  Allergen Reactions  . Caffeine Anxiety and Other (See Comments)  . Iodinated Diagnostic Agents Swelling    Hives and itching  . Other Rash    Bandaids  . Red Dye Other (See Comments)    headache  . Nitrofurantoin Other (See Comments)    headache    Review of Systems  Constitutional: Positive for  malaise/fatigue. Negative for fever.  HENT: Negative for congestion, ear pain, sinus pain and sore throat.   Eyes: Negative for pain.  Respiratory: Negative for cough and shortness of breath.   Cardiovascular: Positive for palpitations. Negative for chest pain and leg swelling.  Gastrointestinal: Negative for abdominal pain, blood in stool and nausea.  Genitourinary: Negative for dysuria and frequency.  Musculoskeletal: Negative for back pain.  Skin: Negative for rash.  Neurological: Negative for dizziness and headaches.  Psychiatric/Behavioral: The patient is nervous/anxious and has insomnia.        Objective:    Physical Exam Constitutional:      Appearance: Normal appearance. She is not ill-appearing.  HENT:     Head: Normocephalic and atraumatic.     Right Ear: External ear normal.     Left Ear: External ear normal.  Eyes:     Pupils: Pupils are equal, round, and reactive to light.  Pulmonary:     Effort: Pulmonary effort is normal.  Musculoskeletal:     Cervical back: Normal range of motion.  Neurological:     Mental Status: She is oriented to person, place, and time.  Psychiatric:        Behavior: Behavior normal.     There were no vitals taken for this visit. Wt Readings from Last 3 Encounters:  05/22/20 296 lb (134.3 kg)  05/01/20 294 lb 2 oz (133.4 kg)  04/27/19 286 lb (129.7 kg)    Diabetic Foot Exam - Simple   No data filed    Lab Results  Component Value Date   WBC 8.5 07/06/2020   HGB 12.9 07/06/2020   HCT 39.7 07/06/2020   PLT 267.0 07/06/2020   GLUCOSE 88 07/06/2020   ALT 12 07/06/2020   AST 12 07/06/2020   NA 139 07/06/2020   K 4.1 07/06/2020   CL 105 07/06/2020   CREATININE 1.15 07/06/2020   BUN 11 07/06/2020   CO2 29 07/06/2020   TSH 2.04 07/06/2020    Lab Results  Component Value Date   TSH 2.04 07/06/2020   Lab Results  Component Value Date   WBC 8.5 07/06/2020   HGB 12.9 07/06/2020   HCT 39.7 07/06/2020   MCV 84.2  07/06/2020   PLT 267.0 07/06/2020   Lab Results  Component Value Date   NA 139 07/06/2020   K 4.1 07/06/2020   CO2 29 07/06/2020   GLUCOSE 88 07/06/2020   BUN 11 07/06/2020   CREATININE 1.15 07/06/2020   BILITOT 0.5 07/06/2020   ALKPHOS 52 07/06/2020   AST 12 07/06/2020  ALT 12 07/06/2020   PROT 7.1 07/06/2020   ALBUMIN 4.3 07/06/2020   CALCIUM 9.2 07/06/2020   GFR 58.57 (L) 07/06/2020   No results found for: CHOL No results found for: HDL No results found for: LDLCALC No results found for: TRIG No results found for: CHOLHDL No results found for: LGXQ1J     Assessment & Plan:   Problem List Items Addressed This Visit    Anxiety    She has done some better since her last visit but does acknowledge a heightened sense of anxiety with episodes of nightmares, dizziness, insomnia, fatigue and racing thoughts. She has a history of OCD as well. She agrees to start on Paxil CR 12.5 mg daily and reassess. She notes red dye from an IV was blamed for headaches but she has had others with no difficulty.       Relevant Medications   PARoxetine (PAXIL CR) 12.5 MG 24 hr tablet      Meds ordered this encounter  Medications  . PARoxetine (PAXIL CR) 12.5 MG 24 hr tablet    Sig: Take 1 tablet (12.5 mg total) by mouth daily.    Dispense:  30 tablet    Refill:  3    I discussed the assessment and treatment plan with the patient. The patient was provided an opportunity to ask questions and all were answered. The patient agreed with the plan and demonstrated an understanding of the instructions.   The patient was advised to call back or seek an in-person evaluation if the symptoms worsen or if the condition fails to improve as anticipated.  I provided 20 minutes of non-face-to-face time during this encounter.

## 2020-08-23 MED ORDER — PAROXETINE HCL ER 12.5 MG PO TB24: 12.5000 mg | ORAL_TABLET | Freq: Every day | ORAL | 3 refills | Status: DC

## 2020-08-23 NOTE — Assessment & Plan Note (Addendum)
She has done some better since her last visit but does acknowledge a heightened sense of anxiety with episodes of nightmares, dizziness, insomnia, fatigue and racing thoughts. She has a history of OCD as well. She agrees to start on Paxil CR 12.5 mg daily and reassess. She notes red dye from an IV was blamed for headaches but she has had others with no difficulty. Spent 20 minutes with patient discussing symptoms and developing plan of care.

## 2020-09-22 ENCOUNTER — Other Ambulatory Visit: Payer: Self-pay | Admitting: Family Medicine

## 2020-09-27 NOTE — Progress Notes (Addendum)
[facility] [address], [address] [place], [place] [address] [phone]  Date:  09/28/2020   Name:  Rhonda Thompson   DOB:  [birth year]   MRN:  [id number]  PCP:  Sandford Craze, NP    Chief Complaint: Stress (Working several hours, no sleep/) and Palpitations (Palpitations, anxiety, frequent panic attacks, lightheadedness/)   History of Present Illness:  Rhonda Thompson is a 45 y.o. very pleasant female patient who presents with the following:  Pt of Sandford Craze, I did see her once in 2020 History of mood disorder, shoulder issue Here today with concern of multiple symptoms - "I want to look at everything to see what might be wrong with me"  Pt notes that she "has not felt good since November 2021" She has had tinnitus from that time and notes worsening anxiety She will note "rushes of lightheadedness for absolutely no reason, my balance will be off and ringing in my ears" She has not been to see ENT as she needed a referral but has not asked for one as of yet  She works many hours and is under a lot of stress-she notes that she may work 60 to 70 hours/week due to short staffing  She was seen in January by Dr Abner Greenspan with palpitations, and again last month with anxiety -prescribed Paxil CR at that time  She never started taking it however due to "I have OCD and have to really think about it prior to starting a new medication" She notes that she has been carrying this medication around with her every day, she feels that she is almost ready to begin taking  Labs done in January  EKG 2019 benign  She is no longer seeing her therapist or psychiatrist- she felt that she was too busy during covid so she stopped doing this  She notes that "my neck and shoulders stay in knots"  Patient Active Problem List   Diagnosis Date Noted  . Sinusitis 07/07/2020  . Neck pain 07/07/2020  . Adhesive capsulitis of left shoulder 12/29/2019   . Episodic mood disorder (HCC) 05/10/2013  . Obsessive-compulsive disorder, unspecified 02/01/2013  . Panic attacks 02/01/2013  . Anxiety 02/01/2013  . Lower urinary tract infectious disease 02/01/2013    Past Medical History:  Diagnosis Date  . Anxiety    has panic attacks  . Depression   . OCD (obsessive compulsive disorder)   . PTSD (post-traumatic stress disorder)     Past Surgical History:  Procedure Laterality Date  . CHOLECYSTECTOMY  06/10/13    Social History   Tobacco Use  . Smoking status: Never Smoker  . Smokeless tobacco: Never Used  Substance Use Topics  . Alcohol use: No    Alcohol/week: 0.0 standard drinks    Family History  Problem Relation Age of Onset  . Hypertension Mother   . Diabetes Mother   . Cancer Mother        ovarian and kidney  . Depression Mother     Allergies  Allergen Reactions  . Caffeine Anxiety and Other (See Comments)  . Iodinated Diagnostic Agents Swelling    Hives and itching  . Other Rash    Bandaids  . Red Dye Other (See Comments)    headache  . Nitrofurantoin Other (See Comments)    headache    Medication list has been reviewed and updated.  Current Outpatient Medications on File Prior to Visit  Medication Sig Dispense Refill  .  acetaminophen (TYLENOL) 500 MG tablet Take 500 mg by mouth every 6 (six) hours as needed.    . ALPRAZolam (XANAX) 0.5 MG tablet Take 1 tablet (0.5 mg total) by mouth daily as needed for anxiety. (Patient not taking: Reported on 09/28/2020) 30 tablet 2  . PARoxetine (PAXIL-CR) 12.5 MG 24 hr tablet TAKE 1 TABLET BY MOUTH EVERY DAY (Patient not taking: Reported on 09/28/2020) 90 tablet 0   No current facility-administered medications on file prior to visit.    Review of Systems:  As per HPI- otherwise negative.   Physical Examination: Vitals:   09/28/20 0825  BP: (!) 148/98  Pulse: 74  Resp: 18  Temp: 97.8 F (36.6 C)  SpO2: 95%   Vitals:   09/28/20 0825  Weight: (!) 306 lb  (138.8 kg)  Height: 5\' 11"  (1.803 m)   Body mass index is 42.68 kg/m. Ideal Body Weight: Weight in (lb) to have BMI = 25: 178.9  GEN: no acute distress.  Obese, seems anxious but otherwise looks well  HEENT: Atraumatic, Normocephalic. Bilateral TM wnl, oropharynx normal.  PEERL,EOMI.   Ears and Nose: No external deformity. CV: RRR, No M/G/R. No JVD. No thrill. No extra heart sounds. PULM: CTA B, no wheezes, crackles, rhonchi. No retractions. No resp. distress. No accessory muscle use. ABD: S, NT, ND, +BS. No rebound. No HSM. EXTR: No c/c/e PSYCH: Normally interactive. Conversant.    Assessment and Plan: Panic attacks  Mixed obsessional thoughts and acts  Tinnitus of left ear - Plan: Ambulatory referral to ENT  Screening for thyroid disorder - Plan: TSH  Screening for deficiency anemia - Plan: CBC  Screening for diabetes mellitus - Plan: Comprehensive metabolic panel, Hemoglobin A1c  Screening for hyperlipidemia - Plan: Lipid panel  Elevated sed rate - Plan: Sedimentation rate, C-reactive protein  Fatigue, unspecified type - Plan: VITAMIN D 25 Hydroxy (Vit-D Deficiency, Fractures)  Menorrhagia with regular cycle - Plan: Ferritin  Patient here today with concern of generalized not feeling well.  She does seem to have quite a bit of anxiety, and is working a lot of hours.  I encouraged her to start taking the Paxil she was prescribed, she is trying to get her mind around taking the medication.  I also encouraged her to restart therapy if at all possible She notes she may feel especially tired a week after her menses, notes heavy periods.  We will check her CBC and ferritin Will plan further follow- up pending labs.  This visit occurred during the SARS-CoV-2 public health emergency.  Safety protocols were in place, including screening questions prior to the visit, additional usage of staff PPE, and extensive cleaning of exam room while observing appropriate contact time as  indicated for disinfecting solutions.    Signed , MD  Received her labs as below, message to pt Results for orders placed or performed in visit on 09/28/20  CBC  Result Value Ref Range   WBC 6.9 4.0 - 10.5 K/uL   RBC 4.77 3.87 - 5.11 Mil/uL   Platelets 275.0 150.0 - 400.0 K/uL   Hemoglobin 12.9 12.0 - 15.0 g/dL   HCT 09/30/20 45.6 - 25.6 %   MCV 82.3 78.0 - 100.0 fl   MCHC 32.9 30.0 - 36.0 g/dL   RDW 38.9 37.3 - 42.8 %  Comprehensive metabolic panel  Result Value Ref Range   Sodium 140 135 - 145 mEq/L   Potassium 4.4 3.5 - 5.1 mEq/L   Chloride 104 96 - 112  mEq/L   CO2 28 19 - 32 mEq/L   Glucose, Bld 86 70 - 99 mg/dL   BUN 10 6 - 23 mg/dL   Creatinine, Ser 6.16 0.40 - 1.20 mg/dL   Total Bilirubin 0.5 0.2 - 1.2 mg/dL   Alkaline Phosphatase 52 39 - 117 U/L   AST 16 0 - 37 U/L   ALT 16 0 - 35 U/L   Total Protein 6.8 6.0 - 8.3 g/dL   Albumin 4.0 3.5 - 5.2 g/dL   GFR 07.37 >10.62 mL/min   Calcium 9.1 8.4 - 10.5 mg/dL  Hemoglobin I9S  Result Value Ref Range   Hgb A1c MFr Bld 5.5 4.6 - 6.5 %  Lipid panel  Result Value Ref Range   Cholesterol 180 0 - 200 mg/dL   Triglycerides 854.6 0.0 - 149.0 mg/dL   HDL 27.03 >50.09 mg/dL   VLDL 38.1 0.0 - 82.9 mg/dL   LDL Cholesterol 937 (H) 0 - 99 mg/dL   Total CHOL/HDL Ratio 4    NonHDL 132.73   TSH  Result Value Ref Range   TSH 2.69 0.35 - 4.50 uIU/mL  VITAMIN D 25 Hydroxy (Vit-D Deficiency, Fractures)  Result Value Ref Range   VITD 14.00 (L) 30.00 - 100.00 ng/mL  Sedimentation rate  Result Value Ref Range   Sed Rate 33 (H) 0 - 20 mm/hr  Ferritin  Result Value Ref Range   Ferritin 11.1 10.0 - 291.0 ng/mL  C-reactive protein  Result Value Ref Range   CRP <1.0 0.5 - 20.0 mg/dL

## 2020-09-27 NOTE — Patient Instructions (Addendum)
Good to see you again today - I will be in touch with your labs I would suggest that you start the paxil Dr Abner Greenspan gave you.  Also, please get back in with your therapist and perhaps consider seeing a psychiatrist.  If you are working so many hours that you cannot take care of your health you may need to set some boundaries at work to protect yourself  We will get you in to see ENT to discuss the ringing you have noticed in your ears also

## 2020-09-28 ENCOUNTER — Encounter: Payer: Self-pay | Admitting: Family Medicine

## 2020-09-28 ENCOUNTER — Other Ambulatory Visit: Payer: Self-pay

## 2020-09-28 ENCOUNTER — Ambulatory Visit: Payer: BC Managed Care – PPO | Admitting: Family Medicine

## 2020-09-28 VITALS — BP 128/90 | HR 74 | Temp 97.8°F | Resp 18 | Ht 71.0 in | Wt 306.0 lb

## 2020-09-28 DIAGNOSIS — N920 Excessive and frequent menstruation with regular cycle: Secondary | ICD-10-CM | POA: Diagnosis not present

## 2020-09-28 DIAGNOSIS — F422 Mixed obsessional thoughts and acts: Secondary | ICD-10-CM

## 2020-09-28 DIAGNOSIS — Z130 Encounter for screening for diseases of the blood and blood-forming organs and certain disorders involving the immune mechanism: Secondary | ICD-10-CM

## 2020-09-28 DIAGNOSIS — R5383 Other fatigue: Secondary | ICD-10-CM

## 2020-09-28 DIAGNOSIS — Z1329 Encounter for screening for other suspected endocrine disorder: Secondary | ICD-10-CM | POA: Diagnosis not present

## 2020-09-28 DIAGNOSIS — R700 Elevated erythrocyte sedimentation rate: Secondary | ICD-10-CM | POA: Diagnosis not present

## 2020-09-28 DIAGNOSIS — H9312 Tinnitus, left ear: Secondary | ICD-10-CM

## 2020-09-28 DIAGNOSIS — Z13220 Encounter for screening for lipoid disorders: Secondary | ICD-10-CM

## 2020-09-28 DIAGNOSIS — F410 Panic disorder [episodic paroxysmal anxiety] without agoraphobia: Secondary | ICD-10-CM | POA: Diagnosis not present

## 2020-09-28 DIAGNOSIS — Z131 Encounter for screening for diabetes mellitus: Secondary | ICD-10-CM

## 2020-09-28 DIAGNOSIS — E559 Vitamin D deficiency, unspecified: Secondary | ICD-10-CM

## 2020-09-28 LAB — CBC
HCT: 39.2 % (ref 36.0–46.0)
Hemoglobin: 12.9 g/dL (ref 12.0–15.0)
MCHC: 32.9 g/dL (ref 30.0–36.0)
MCV: 82.3 fl (ref 78.0–100.0)
Platelets: 275 10*3/uL (ref 150.0–400.0)
RBC: 4.77 Mil/uL (ref 3.87–5.11)
RDW: 14.4 % (ref 11.5–15.5)
WBC: 6.9 10*3/uL (ref 4.0–10.5)

## 2020-09-28 LAB — LIPID PANEL
Cholesterol: 180 mg/dL (ref 0–200)
HDL: 47.4 mg/dL (ref >39.00)
LDL Cholesterol: 112 mg/dL — ABNORMAL HIGH (ref 0–99)
NonHDL: 132.73
Total CHOL/HDL Ratio: 4
Triglycerides: 105 mg/dL (ref 0.0–149.0)
VLDL: 21 mg/dL (ref 0.0–40.0)

## 2020-09-28 LAB — COMPREHENSIVE METABOLIC PANEL
ALT: 16 U/L (ref 0–35)
AST: 16 U/L (ref 0–37)
Albumin: 4 g/dL (ref 3.5–5.2)
Alkaline Phosphatase: 52 U/L (ref 39–117)
BUN: 10 mg/dL (ref 6–23)
CO2: 28 mEq/L (ref 19–32)
Calcium: 9.1 mg/dL (ref 8.4–10.5)
Chloride: 104 mEq/L (ref 96–112)
Creatinine, Ser: 0.84 mg/dL (ref 0.40–1.20)
GFR: 85.25 mL/min (ref >60.00)
Glucose, Bld: 86 mg/dL (ref 70–99)
Potassium: 4.4 mEq/L (ref 3.5–5.1)
Sodium: 140 mEq/L (ref 135–145)
Total Bilirubin: 0.5 mg/dL (ref 0.2–1.2)
Total Protein: 6.8 g/dL (ref 6.0–8.3)

## 2020-09-28 LAB — FERRITIN: Ferritin: 11.1 ng/mL (ref 10.0–291.0)

## 2020-09-28 LAB — VITAMIN D 25 HYDROXY (VIT D DEFICIENCY, FRACTURES): VITD: 14 ng/mL — ABNORMAL LOW (ref 30.00–100.00)

## 2020-09-28 LAB — SEDIMENTATION RATE: Sed Rate: 33 mm/hr — ABNORMAL HIGH (ref 0–20)

## 2020-09-28 LAB — C-REACTIVE PROTEIN: CRP: <1 mg/dL (ref 0.5–20.0)

## 2020-09-28 LAB — HEMOGLOBIN A1C: Hgb A1c MFr Bld: 5.5 % (ref 4.6–6.5)

## 2020-09-28 LAB — TSH: TSH: 2.69 u[IU]/mL (ref 0.35–4.50)

## 2020-09-28 MED ORDER — VITAMIN D3 1.25 MG (50000 UT) PO CAPS: ORAL_CAPSULE | ORAL | 0 refills | Status: DC

## 2020-09-28 NOTE — Addendum Note (Signed)
Addended by: Abbe Amsterdam C on: 09/28/2020 08:09 PM   Modules accepted: Orders

## 2020-10-03 IMAGING — CT CT ABD-PELV W/O CM
2 of 4 series · 13 of 46 positions shown, 15 images · non-contrast
Comparison: None.

CLINICAL DATA: Abdominal pain

EXAM:
CT ABDOMEN AND PELVIS WITHOUT CONTRAST
TECHNIQUE: Multidetector CT imaging of the abdomen and pelvis was performed
following the standard protocol without oral or IV contrast.

[Series 2: routine abdomen pelvis without 5.00 br40 s3 axial · axial · non-contrast · 0.73mm/px · z∈[+1287,+1707]mm · 10 of 102 slices shown, 12 images]
[im 9/102  soft-tissue]
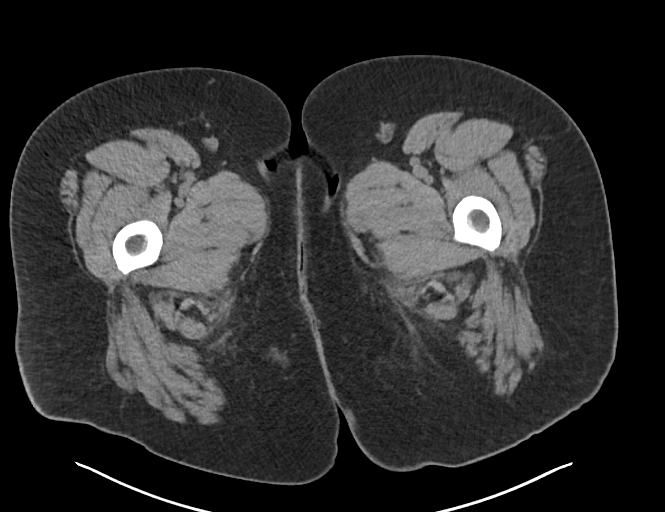
[im 9/102  bone]
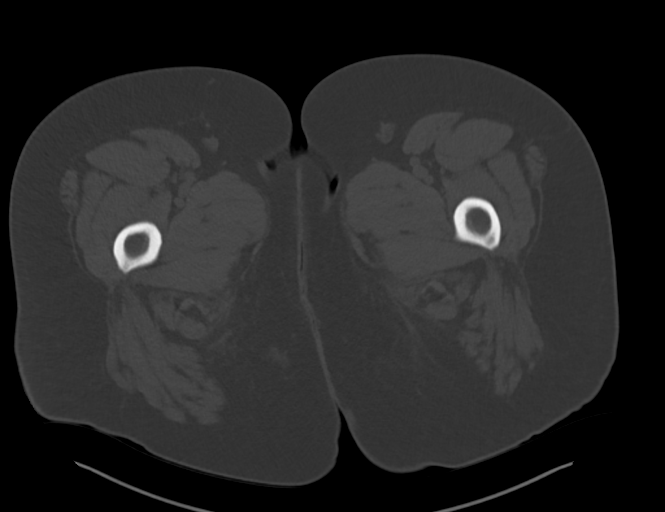
[im 17/102  soft-tissue]
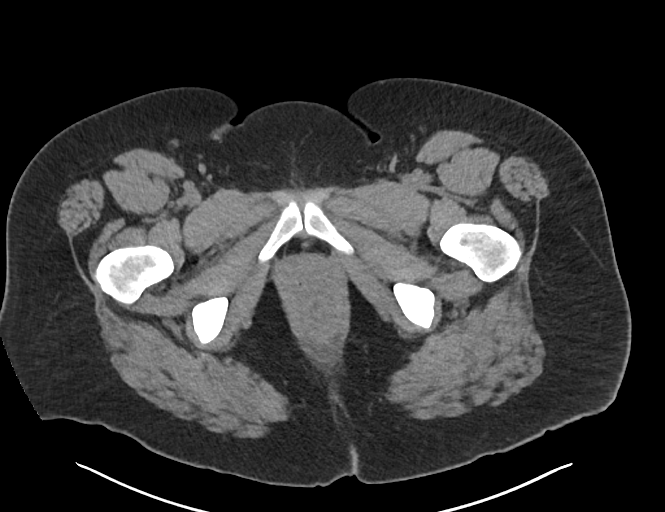
[im 26/102  soft-tissue]
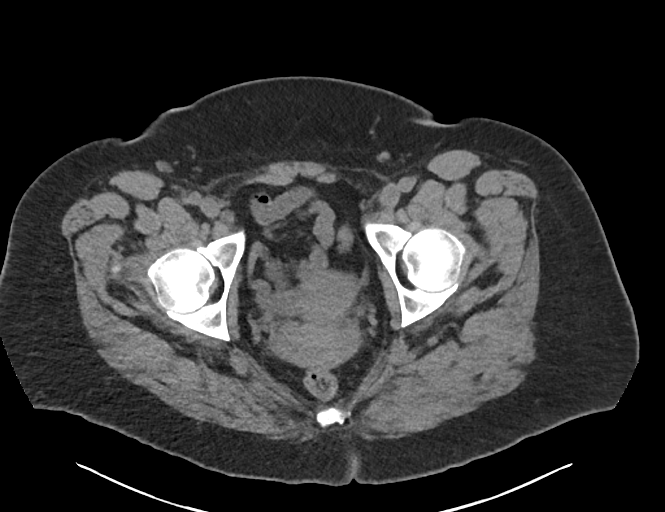
[im 38/102  soft-tissue]
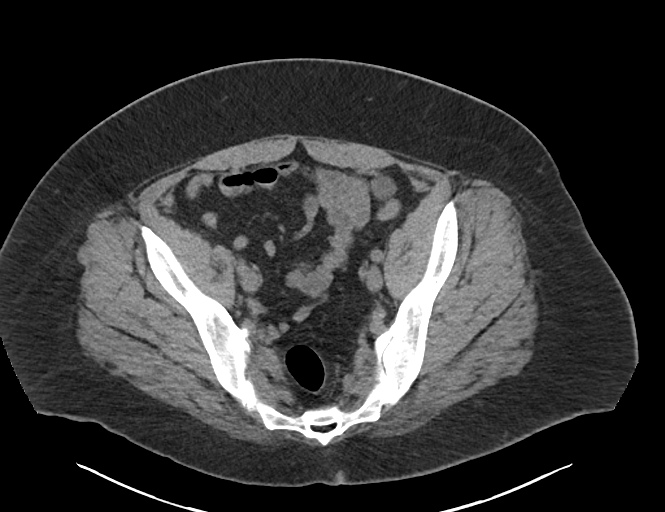
[im 47/102  soft-tissue]
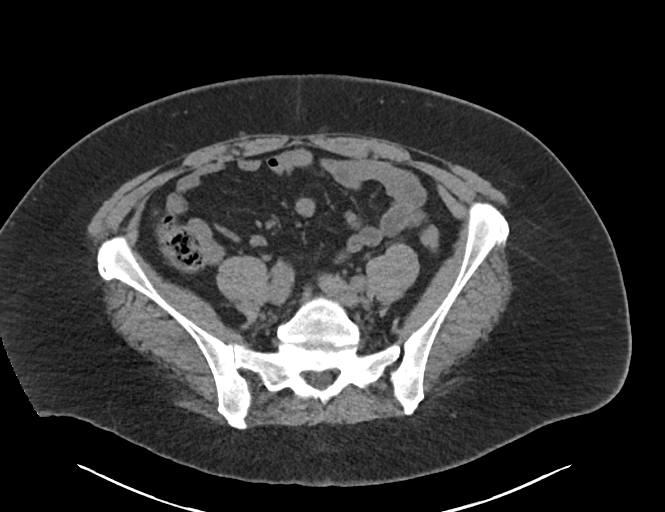
[im 55/102  soft-tissue]
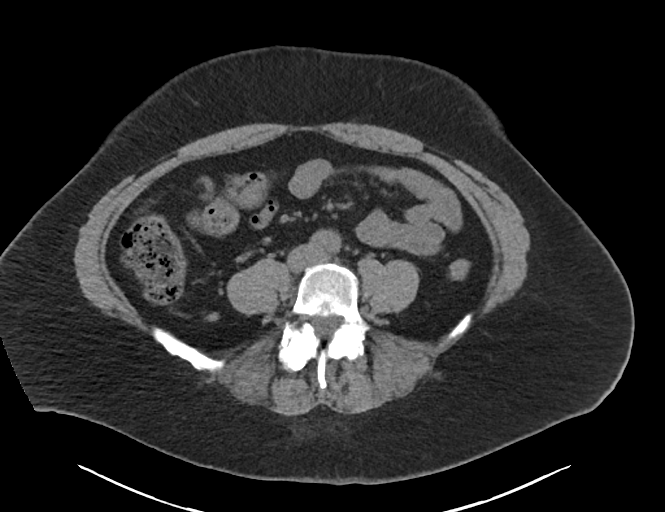
[im 64/102  soft-tissue]
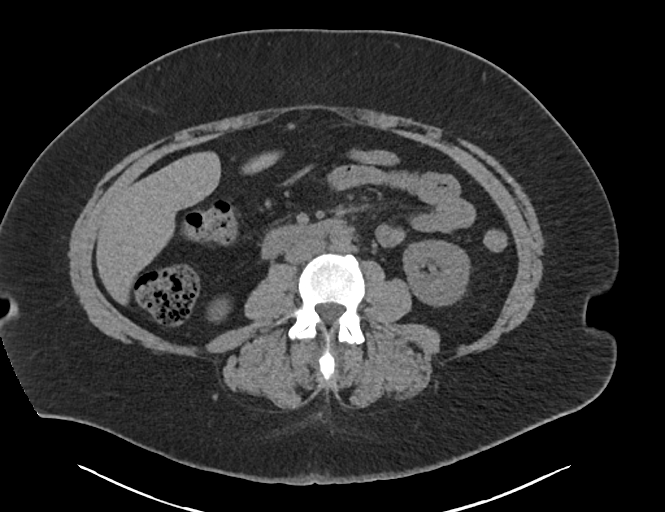
[im 76/102  soft-tissue]
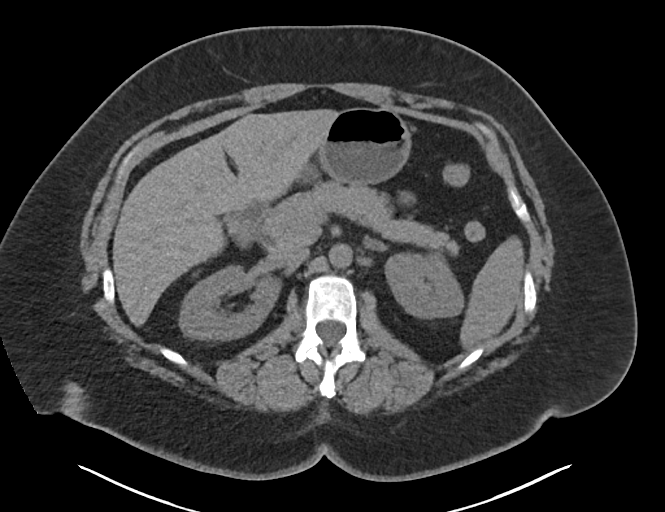
[im 85/102  soft-tissue]
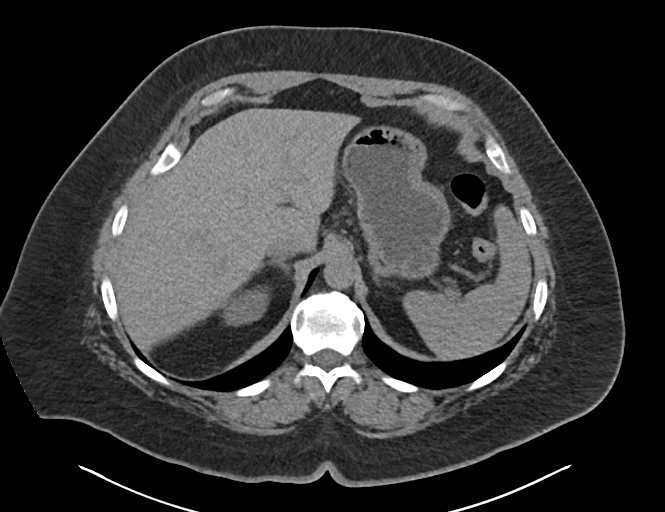
[im 85/102  bone]
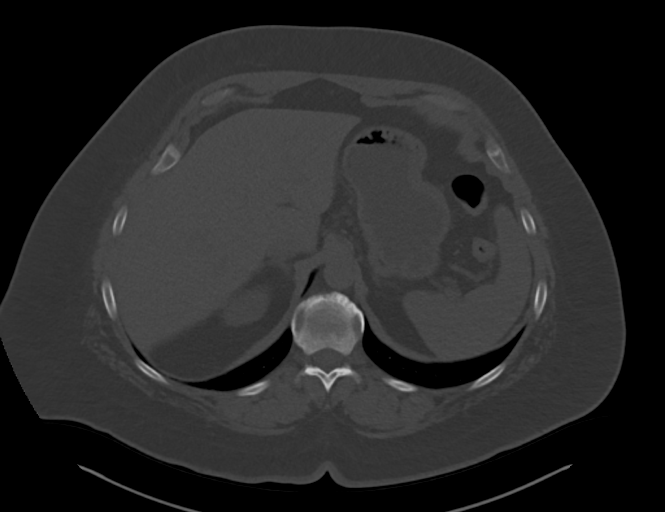
[im 93/102  soft-tissue]
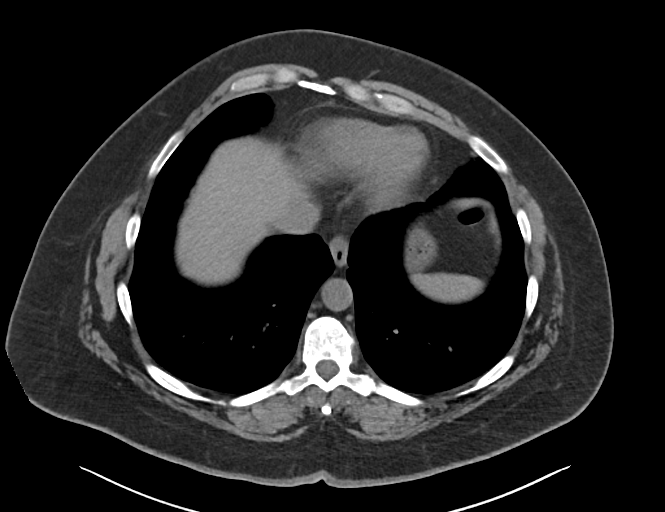

[Series 4: routine abdomen pelvis without 2.00 br40 s3 cor · coronal · non-contrast · 0.92mm/px · 3 of 173 slices shown]
[im 58/173  soft-tissue]
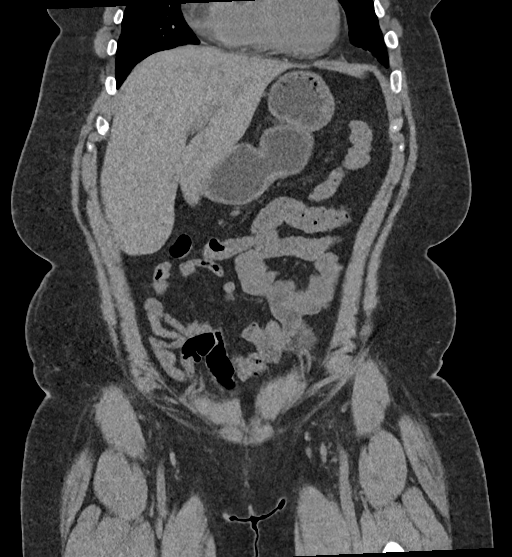
[im 77/173  soft-tissue]
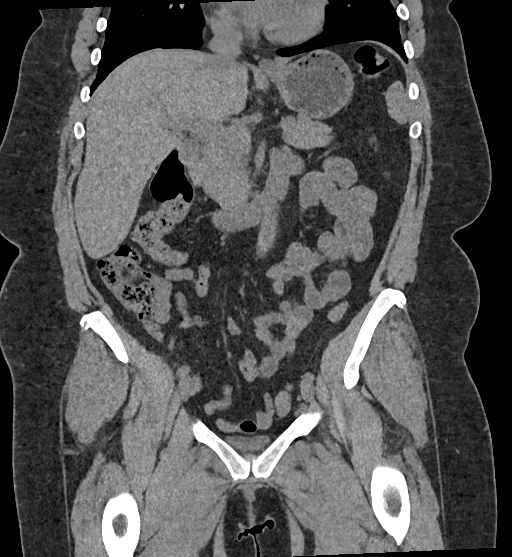
[im 96/173  soft-tissue]
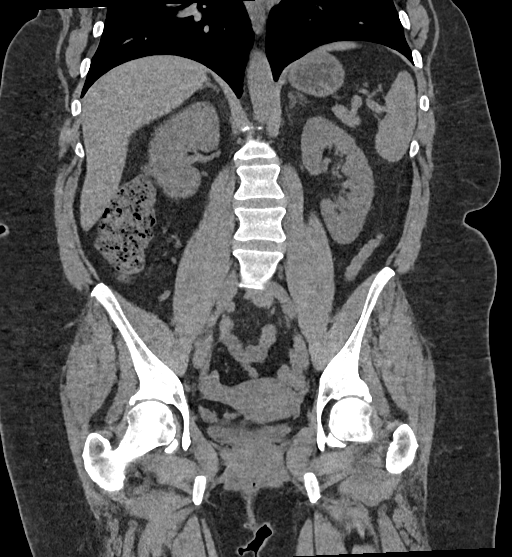

[13 of 46 positions shown; findings below may reference images not displayed]

FINDINGS: Lower chest: Lung bases are clear.

Hepatobiliary: No focal liver lesions are evident on this
noncontrast enhanced study. Gallbladder is absent. There is no
evident biliary duct dilatation.

Pancreas: There is no pancreatic mass or inflammatory focus.

Spleen: No splenic lesions are evident.

Adrenals/Urinary Tract: Adrenals bilaterally appear normal. Kidneys
bilaterally show no evident mass or hydronephrosis on either side.
There is no evident renal or ureteral calculus on either side.
Urinary bladder is midline with wall thickness within normal limits.

Stomach/Bowel: There is no appreciable bowel wall or mesenteric
thickening. Terminal ileum appears normal. There is mild fatty
infiltration in the ileocecal valve. There is no evident bowel
obstruction. There is no free air or portal venous air.

Vascular/Lymphatic: No abdominal aortic aneurysm. No vascular
lesions are evident on this noncontrast enhanced study. There is a
retroaortic left renal vein, an anatomic variant. No adenopathy is
evident in the abdomen or pelvis.

Reproductive: Uterus is anteverted.  No pelvic masses are evident.

Other: Appendix appears normal. No appreciable abscess or ascites in
the abdomen or pelvis. There is scarring at the level of the
umbilicus. There is a tiny ventral hernia slightly superior to the
umbilicus containing only fat.

Musculoskeletal: There is degenerative change in the lower lumbar
spine. There are no blastic or lytic bone lesions. There is no
intramuscular lesion.
IMPRESSION: 1. No bowel wall thickening or bowel obstruction evident. No abscess
in the abdomen or pelvis. Appendix appears normal.

2. No evident renal or ureteral calculus. No hydronephrosis. Urinary
bladder wall thickness is normal.

3. Rather minimal midline ventral hernia slightly superior to the
umbilicus containing only fat.

## 2020-10-26 ENCOUNTER — Telehealth: Payer: BC Managed Care – PPO | Admitting: Physician Assistant

## 2020-10-26 DIAGNOSIS — J020 Streptococcal pharyngitis: Secondary | ICD-10-CM

## 2020-10-26 MED ORDER — AMOXICILLIN 500 MG PO CAPS: 500.0000 mg | ORAL_CAPSULE | Freq: Two times a day (BID) | ORAL | 0 refills | Status: DC

## 2020-10-26 NOTE — Progress Notes (Signed)

## 2020-11-02 ENCOUNTER — Other Ambulatory Visit: Payer: Self-pay

## 2020-11-02 ENCOUNTER — Ambulatory Visit: Payer: BC Managed Care – PPO | Admitting: Family Medicine

## 2020-11-02 DIAGNOSIS — F419 Anxiety disorder, unspecified: Secondary | ICD-10-CM | POA: Diagnosis not present

## 2020-11-02 DIAGNOSIS — E559 Vitamin D deficiency, unspecified: Secondary | ICD-10-CM

## 2020-11-02 DIAGNOSIS — N390 Urinary tract infection, site not specified: Secondary | ICD-10-CM

## 2020-11-02 DIAGNOSIS — E538 Deficiency of other specified B group vitamins: Secondary | ICD-10-CM

## 2020-11-02 DIAGNOSIS — R002 Palpitations: Secondary | ICD-10-CM

## 2020-11-02 DIAGNOSIS — R5383 Other fatigue: Secondary | ICD-10-CM | POA: Diagnosis not present

## 2020-11-02 LAB — VITAMIN B12: Vitamin B-12: 196 pg/mL — ABNORMAL LOW (ref 211–911)

## 2020-11-02 MED ORDER — PAROXETINE HCL ER 12.5 MG PO TB24: 12.5000 mg | ORAL_TABLET | Freq: Every day | ORAL | 3 refills | Status: DC

## 2020-11-02 NOTE — Assessment & Plan Note (Signed)
Has multiple episodes everyday. Proceed with echo and repeat ekg today.

## 2020-11-02 NOTE — Patient Instructions (Signed)
60-80 ounces of clear liquids ounces daily   Vertigo Vertigo is the feeling that you or the things around you are moving when they are not. This feeling can come and go at any time. Vertigo often goes away on its own. This condition can be dangerous if it happens when you are doing activities like driving or working with machines. Your doctor will do tests to find the cause of your vertigo. These tests will also help your doctor decide on the best treatment for you. Follow these instructions at home: Eating and drinking  Drink enough fluid to keep your pee (urine) pale yellow.  Do not drink alcohol.      Activity  Return to your normal activities as told by your doctor. Ask your doctor what activities are safe for you.  In the morning, first sit up on the side of the bed. When you feel okay, stand slowly while you hold onto something until you know that your balance is fine.  Move slowly. Avoid sudden body or head movements or certain positions, as told by your doctor.  Use a cane if you have trouble standing or walking.  Sit down right away if you feel dizzy.  Avoid doing any tasks or activities that can cause danger to you or others if you get dizzy.  Avoid bending down if you feel dizzy. Place items in your home so that they are easy for you to reach without leaning over.  Do not drive or use heavy machinery if you feel dizzy. General instructions  Take over-the-counter and prescription medicines only as told by your doctor.  Keep all follow-up visits as told by your doctor. This is important. Contact a doctor if:  Your medicine does not help your vertigo.  You have a fever.  Your problems get worse or you have new symptoms.  Your family or friends see changes in your behavior.  The feeling of being sick to your stomach gets worse.  Your vomiting gets worse.  You lose feeling (have numbness) in part of your body.  You feel prickling and tingling in a part of  your body. Get help right away if:  You have trouble moving or talking.  You are always dizzy.  You pass out (faint).  You get very bad headaches.  You feel weak in your hands, arms, or legs.  You have changes in your hearing.  You have changes in how you see (vision).  You get a stiff neck.  Bright light starts to bother you. Summary  Vertigo is the feeling that you or the things around you are moving when they are not.  Your doctor will do tests to find the cause of your vertigo.  You may be told to avoid some tasks, positions, or movements.  Contact a doctor if your medicine is not helping, or if you have a fever, new symptoms, or a change in behavior.  Get help right away if you get very bad headaches, or if you have changes in how you speak, hear, or see. This information is not intended to replace advice given to you by your health care provider. Make sure you discuss any questions you have with your health care provider. Document Revised: 04/20/2018 Document Reviewed: 04/20/2018 Elsevier Patient Education  2021 [employer].

## 2020-11-02 NOTE — Progress Notes (Signed)
Patient ID: Rhonda Thompson, female    DOB: 1976/05/03  Age: 45 y.o. MRN: [id number]    Subjective:  Subjective  HPI Rhonda Thompson presents for office visit today for follow up on blood works concerning vitamin D deficiency and heart palpitations. She reports that she still experiences heart palpitation, which she states might be contributing to her anxiety. She denies any chest pain, SOB, fever, abdominal pain, cough, chills, sore throat, dysuria, urinary incontinence, back pain, HA, or N/VD. She reports that she is currently experiencing a lot of life stresses which might be a contributing factor to her anxiety. She reports that she recently got sick with the flu and strip which might have added to her anxiety as well.   She reports that her heart palpitations get worse when eating or staying stationary. She states that her symptoms are intermittent and happens multiple times a day. She denies any sweating, chest pressure, chills, or SOB when experiencing the palpitations, but notes feeling tension in her muscles in her chest region that sometimes spasm. She reports that when she is experiencing excess anxiety, the palpitations worsen and feel like pounding. She states that she used to go to the gym for physical exercise, but has stopped 5 years ago due to experiencing vertigo. She states that she is not hydrating as much as she should.   Review of Systems  Constitutional: Negative for chills, fatigue and fever.  HENT: Negative for congestion, rhinorrhea, sinus pressure, sinus pain and sore throat.   Eyes: Negative for pain.  Respiratory: Negative for cough and shortness of breath.   Cardiovascular: Positive for palpitations. Negative for chest pain and leg swelling.  Gastrointestinal: Negative for abdominal pain, blood in stool, diarrhea, nausea and vomiting.  Genitourinary: Negative for decreased urine volume, flank pain, frequency, vaginal bleeding and vaginal discharge.  Musculoskeletal: Negative  for back pain.  Neurological: Positive for dizziness (vertigo). Negative for headaches.    History Past Medical History:  Diagnosis Date  . Anxiety    has panic attacks  . Depression   . OCD (obsessive compulsive disorder)   . PTSD (post-traumatic stress disorder)     She has a past surgical history that includes Cholecystectomy (06/10/13).   Her family history includes Cancer in her mother; Depression in her mother; Diabetes in her mother; Hypertension in her mother.She reports that she has never smoked. She has never used smokeless tobacco. She reports that she does not drink alcohol. No history on file for drug use.  Current Outpatient Medications on File Prior to Visit  Medication Sig Dispense Refill  . acetaminophen (TYLENOL) 500 MG tablet Take 500 mg by mouth every 6 (six) hours as needed.    . ALPRAZolam (XANAX) 0.5 MG tablet Take 1 tablet (0.5 mg total) by mouth daily as needed for anxiety. 30 tablet 2  . Cholecalciferol (VITAMIN D3) 1.25 MG (50000 UT) CAPS Take 1 weekly for 12 weeks 12 capsule 0   No current facility-administered medications on file prior to visit.     Objective:  Objective  Physical Exam Constitutional:      General: She is not in acute distress.    Appearance: Normal appearance. She is not ill-appearing or toxic-appearing.  HENT:     Head: Normocephalic and atraumatic.     Right Ear: Tympanic membrane, ear canal and external ear normal.     Left Ear: Tympanic membrane, ear canal and external ear normal.     Nose: No congestion or rhinorrhea.  Eyes:  Extraocular Movements: Extraocular movements intact.     Pupils: Pupils are equal, round, and reactive to light.  Cardiovascular:     Rate and Rhythm: Normal rate and regular rhythm.     Pulses: Normal pulses.     Heart sounds: Normal heart sounds. No murmur heard.   Pulmonary:     Effort: Pulmonary effort is normal. No respiratory distress.     Breath sounds: Normal breath sounds. No wheezing,  rhonchi or rales.  Abdominal:     General: Bowel sounds are normal.     Palpations: Abdomen is soft. There is no mass.     Tenderness: There is no abdominal tenderness. There is no guarding.     Hernia: No hernia is present.  Musculoskeletal:        General: Normal range of motion.     Cervical back: Normal range of motion and neck supple.  Skin:    General: Skin is warm and dry.  Neurological:     Mental Status: She is alert and oriented to person, place, and time.  Psychiatric:        Behavior: Behavior normal.    There were no vitals taken for this visit. Wt Readings from Last 3 Encounters:  09/28/20 (!) 306 lb (138.8 kg)  05/22/20 296 lb (134.3 kg)  05/01/20 294 lb 2 oz (133.4 kg)     Lab Results  Component Value Date   WBC 6.9 09/28/2020   HGB 12.9 09/28/2020   HCT 39.2 09/28/2020   PLT 275.0 09/28/2020   GLUCOSE 86 09/28/2020   CHOL 180 09/28/2020   TRIG 105.0 09/28/2020   HDL 47.40 09/28/2020   LDLCALC 112 (H) 09/28/2020   ALT 16 09/28/2020   AST 16 09/28/2020   NA 140 09/28/2020   K 4.4 09/28/2020   CL 104 09/28/2020   CREATININE 0.84 09/28/2020   BUN 10 09/28/2020   CO2 28 09/28/2020   TSH 2.69 09/28/2020   HGBA1C 5.5 09/28/2020    CT Abdomen Pelvis Wo Contrast  Result Date: 04/15/2019 CLINICAL DATA:  Abdominal pain EXAM: CT ABDOMEN AND PELVIS WITHOUT CONTRAST TECHNIQUE: Multidetector CT imaging of the abdomen and pelvis was performed following the standard protocol without oral or IV contrast. COMPARISON:  None. FINDINGS: Lower chest: Lung bases are clear. Hepatobiliary: No focal liver lesions are evident on this noncontrast enhanced study. Gallbladder is absent. There is no evident biliary duct dilatation. Pancreas: There is no pancreatic mass or inflammatory focus. Spleen: No splenic lesions are evident. Adrenals/Urinary Tract: Adrenals bilaterally appear normal. Kidneys bilaterally show no evident mass or hydronephrosis on either side. There is no  evident renal or ureteral calculus on either side. Urinary bladder is midline with wall thickness within normal limits. Stomach/Bowel: There is no appreciable bowel wall or mesenteric thickening. Terminal ileum appears normal. There is mild fatty infiltration in the ileocecal valve. There is no evident bowel obstruction. There is no free air or portal venous air. Vascular/Lymphatic: No abdominal aortic aneurysm. No vascular lesions are evident on this noncontrast enhanced study. There is a retroaortic left renal vein, an anatomic variant. No adenopathy is evident in the abdomen or pelvis. Reproductive: Uterus is anteverted.  No pelvic masses are evident. Other: Appendix appears normal. No appreciable abscess or ascites in the abdomen or pelvis. There is scarring at the level of the umbilicus. There is a tiny ventral hernia slightly superior to the umbilicus containing only fat. Musculoskeletal: There is degenerative change in the lower lumbar spine. There are  no blastic or lytic bone lesions. There is no intramuscular lesion. IMPRESSION: 1. No bowel wall thickening or bowel obstruction evident. No abscess in the abdomen or pelvis. Appendix appears normal. 2. No evident renal or ureteral calculus. No hydronephrosis. Urinary bladder wall thickness is normal. 3. Rather minimal midline ventral hernia slightly superior to the umbilicus containing only fat. Electronically Signed   By: Bretta Bang III M.D.   On: 04/15/2019 09:40     Assessment & Plan:  Plan    Meds ordered this encounter  Medications  . PARoxetine (PAXIL-CR) 12.5 MG 24 hr tablet    Sig: Take 1-2 tablets (12.5-25 mg total) by mouth daily.    Dispense:  60 tablet    Refill:  3    Problem List Items Addressed This Visit    Anxiety    She continues to struggle with daily anxiety and she has been under a great deal of stress recently she has had increased episodes of palpiations. She will titrate up her Paroxetine CR from 12.5 to 23 mg  daily      Relevant Medications   PARoxetine (PAXIL-CR) 12.5 MG 24 hr tablet   Lower urinary tract infectious disease   Palpitations    Has multiple episodes everyday. Proceed with echo and repeat ekg today.       Relevant Orders   Vitamin B12 (Completed)   EKG 12-Lead (Completed)   ECHO COMPLETE (BACK OFFICE)   Fatigue - Primary    Likely multifactorial will increase Paroxetine, check 2D echo due to palpitations and check labs. Report worsening symptoms      Relevant Orders   Vitamin B12 (Completed)   Vitamin D deficiency    Labs reveal deficiency. Start on Vitamin D 40981 IU caps, 1 cap po weekly x 12 weeks. Disp #4 with 4 rf. Also take daily Vitamin D over the counter. If already taking a daily supplement increase by 1000 IU daily and if not start Vitamin D 2000 IU daily.       Vitamin B12 deficiency    But negative Intrinsic Factor. Start with injections but will likely then be able to continue on Sublingual tablets at 1000 mcg daily         Follow-up: Return in about 3 months (around 02/02/2021).   I,David Hanna,acting as a scribe for Danise Edge, MD.,have documented all relevant documentation on the behalf of Danise Edge, MD,as directed by  Danise Edge, MD while in the presence of Danise Edge, MD.  I, Bradd Canary, MD personally performed the services described in this documentation. All medical record entries made by the scribe were at my direction and in my presence. I have reviewed the chart and agree that the record reflects my personal performance and is accurate and complete

## 2020-11-03 ENCOUNTER — Telehealth: Payer: Self-pay

## 2020-11-03 ENCOUNTER — Other Ambulatory Visit: Payer: Self-pay | Admitting: *Deleted

## 2020-11-03 ENCOUNTER — Other Ambulatory Visit: Payer: BC Managed Care – PPO

## 2020-11-03 DIAGNOSIS — E538 Deficiency of other specified B group vitamins: Secondary | ICD-10-CM

## 2020-11-03 NOTE — Telephone Encounter (Signed)
[facility] from [facility] called and wanted clarification on pt Paroxetine. A 90 day supply was sent on 09/25/20 and it was for once daily . On 11/05/2020 the signature changed to 1-2 daily pt insurance will reject Rx because its too soon unless the Rx is changed to 25 mg daily please advise. -JMA

## 2020-11-03 NOTE — Telephone Encounter (Signed)
OK then if you would please change the prescription to 2 tabs po daily. Thanks

## 2020-11-05 LAB — INTRINSIC FACTOR ANTIBODIES: Intrinsic Factor: NEGATIVE

## 2020-11-06 DIAGNOSIS — E538 Deficiency of other specified B group vitamins: Secondary | ICD-10-CM | POA: Insufficient documentation

## 2020-11-06 DIAGNOSIS — R5383 Other fatigue: Secondary | ICD-10-CM | POA: Insufficient documentation

## 2020-11-06 DIAGNOSIS — E559 Vitamin D deficiency, unspecified: Secondary | ICD-10-CM | POA: Insufficient documentation

## 2020-11-06 NOTE — Assessment & Plan Note (Signed)
Likely multifactorial will increase Paroxetine, check 2D echo due to palpitations and check labs. Report worsening symptoms

## 2020-11-06 NOTE — Assessment & Plan Note (Signed)
But negative Intrinsic Factor. Start with injections but will likely then be able to continue on Sublingual tablets at 1000 mcg daily

## 2020-11-06 NOTE — Assessment & Plan Note (Signed)
Labs reveal deficiency. Start on Vitamin D 50000 IU caps, 1 cap po weekly x 12 weeks. Disp #4 with 4 rf. Also take daily Vitamin D over the counter. If already taking a daily supplement increase by 1000 IU daily and if not start Vitamin D 2000 IU daily.  

## 2020-11-06 NOTE — Assessment & Plan Note (Addendum)
She continues to struggle with daily anxiety and she has been under a great deal of stress recently she has had increased episodes of palpiations. She will titrate up her Paroxetine CR from 12.5 to 23 mg daily

## 2020-11-08 ENCOUNTER — Other Ambulatory Visit: Payer: Self-pay | Admitting: Family

## 2020-11-08 MED ORDER — PAROXETINE HCL ER 25 MG PO TB24: 25.0000 mg | ORAL_TABLET | Freq: Every day | ORAL | 1 refills | Status: DC

## 2020-11-08 NOTE — Telephone Encounter (Signed)
Called the pt just to advise of the change her insurance requested and she stated that she wanted to try another medicine other than Paroxetine she has been trying it for around 3 weeks and stated that she feels anxious all the time. Please advise. -JMA

## 2020-11-08 NOTE — Telephone Encounter (Signed)
[facility] pt insurance benefit plan called and stated that they would only cover 25 mg once daily. Vernona Rieger can you please send the Rx for 25 mg once daily to pt pharmacy? -JMA

## 2020-11-08 NOTE — Telephone Encounter (Signed)
OK d/c the paroxetine and start her on Fluoxetine 20 mg tabs, 1/2 po daily x 7 days then increase to 1 tab daily and she needs a f/u with me in 6-10 weeks to see how she is doing, does not have to be in person. Remind her it does not work quickly takes 6-12 weeks so unless she is worse or has significant side effects she should take it daily for that period of time to know if it can help

## 2020-11-09 NOTE — Telephone Encounter (Signed)
Called pt and left VM to return call to office. -JMA 

## 2020-11-10 ENCOUNTER — Ambulatory Visit: Payer: BC Managed Care – PPO

## 2020-11-10 MED ORDER — FLUOXETINE HCL 20 MG PO TABS: ORAL_TABLET | ORAL | 1 refills | Status: DC

## 2020-11-10 NOTE — Telephone Encounter (Signed)
Spoke with patient she will start fluoxetine.  Medication sent in and appointment made for 12/25/20 for follow up.

## 2020-11-10 NOTE — Addendum Note (Signed)
Addended by: Thelma Barge D on: 11/10/2020 01:46 PM   Modules accepted: Orders

## 2020-11-14 ENCOUNTER — Telehealth: Payer: BC Managed Care – PPO | Admitting: Physician Assistant

## 2020-11-14 DIAGNOSIS — J208 Acute bronchitis due to other specified organisms: Secondary | ICD-10-CM | POA: Diagnosis not present

## 2020-11-14 DIAGNOSIS — B9689 Other specified bacterial agents as the cause of diseases classified elsewhere: Secondary | ICD-10-CM

## 2020-11-14 MED ORDER — BENZONATATE 100 MG PO CAPS: 100.0000 mg | ORAL_CAPSULE | Freq: Three times a day (TID) | ORAL | 0 refills | Status: DC | PRN

## 2020-11-14 MED ORDER — DOXYCYCLINE HYCLATE 100 MG PO TABS: 100.0000 mg | ORAL_TABLET | Freq: Two times a day (BID) | ORAL | 0 refills | Status: DC

## 2020-11-14 NOTE — Progress Notes (Signed)
We are sorry that you are not feeling well.  Here is how we plan to help!  Based on your presentation I believe you most likely have A cough due to bacteria.  When patients have a fever and a productive cough with a change in color or increased sputum production, we are concerned about bacterial bronchitis.  If left untreated it can progress to pneumonia.  If your symptoms do not improve with your treatment plan it is important that you contact your provider.   I have prescribed Doxycycline 100 mg twice a day for 7 days     In addition you may use A prescription cough medication called Tessalon Perles 100mg . You may take 1-2 capsules every 8 hours as needed for your cough.  If this is not calming down symptoms, you need to be seen in person for full assessment including lung examination and possible imaging.   From your responses in the eVisit questionnaire you describe inflammation in the upper respiratory tract which is causing a significant cough.  This is commonly called Bronchitis and has four common causes:    Allergies  Viral Infections  Acid Reflux  Bacterial Infection Allergies, viruses and acid reflux are treated by controlling symptoms or eliminating the cause. An example might be a cough caused by taking certain blood pressure medications. You stop the cough by changing the medication. Another example might be a cough caused by acid reflux. Controlling the reflux helps control the cough.  USE OF BRONCHODILATOR ("RESCUE") INHALERS: There is a risk from using your bronchodilator too frequently.  The risk is that over-reliance on a medication which only relaxes the muscles surrounding the breathing tubes can reduce the effectiveness of medications prescribed to reduce swelling and congestion of the tubes themselves.  Although you feel brief relief from the bronchodilator inhaler, your asthma may actually be worsening with the tubes becoming more swollen and filled with mucus.  This can  delay other crucial treatments, such as oral steroid medications. If you need to use a bronchodilator inhaler daily, several times per day, you should discuss this with your provider.  There are probably better treatments that could be used to keep your asthma under control.     HOME CARE . Only take medications as instructed by your medical team. . Complete the entire course of an antibiotic. . Drink plenty of fluids and get plenty of rest. . Avoid close contacts especially the very young and the elderly . Cover your mouth if you cough or cough into your sleeve. . Always remember to wash your hands . A steam or ultrasonic humidifier can help congestion.   GET HELP RIGHT AWAY IF: . You develop worsening fever. . You become short of breath . You cough up blood. . Your symptoms persist after you have completed your treatment plan MAKE SURE YOU   Understand these instructions.  Will watch your condition.  Will get help right away if you are not doing well or get worse.  Your e-visit answers were reviewed by a board certified advanced clinical practitioner to complete your personal care plan.  Depending on the condition, your plan could have included both over the counter or prescription medications. If there is a problem please reply  once you have received a response from your provider. Your safety is important to .  If you have drug allergies check your prescription carefully.    You can use MyChart to ask questions about today's visit, request a non-urgent call back,  or ask for a work or school excuse for 24 hours related to this e-Visit. If it has been greater than 24 hours you will need to follow up with your provider, or enter a new e-Visit to address those concerns. You will get an e-mail in the next two days asking about your experience.  I hope that your e-visit has been valuable and will speed your recovery. Thank you for using e-visits.

## 2020-11-14 NOTE — Progress Notes (Signed)
I have spent 5 minutes in review of e-visit questionnaire, review and updating patient chart, medical decision making and response to patient.   Tore Carreker Cody Ikenna Ohms, PA-C    

## 2020-11-28 ENCOUNTER — Other Ambulatory Visit: Payer: Self-pay

## 2020-11-28 ENCOUNTER — Encounter: Payer: Self-pay | Admitting: Family

## 2020-11-28 ENCOUNTER — Ambulatory Visit: Payer: BC Managed Care – PPO | Admitting: Family

## 2020-11-28 ENCOUNTER — Ambulatory Visit (HOSPITAL_BASED_OUTPATIENT_CLINIC_OR_DEPARTMENT_OTHER)
Admission: RE | Admit: 2020-11-28 | Discharge: 2020-11-28 | Disposition: A | Discharge status: Home / Self Care | Payer: BC Managed Care – PPO | Source: Ambulatory Visit | Attending: Family | Admitting: Family

## 2020-11-28 VITALS — BP 140/98 | HR 88 | Temp 98.0°F | Ht 71.0 in | Wt 301.0 lb

## 2020-11-28 DIAGNOSIS — R030 Elevated blood-pressure reading, without diagnosis of hypertension: Secondary | ICD-10-CM | POA: Diagnosis not present

## 2020-11-28 DIAGNOSIS — R053 Chronic cough: Secondary | ICD-10-CM

## 2020-11-28 DIAGNOSIS — J019 Acute sinusitis, unspecified: Secondary | ICD-10-CM

## 2020-11-28 DIAGNOSIS — R059 Cough, unspecified: Secondary | ICD-10-CM | POA: Diagnosis not present

## 2020-11-28 MED ORDER — HYDROCOD POLST-CPM POLST ER 10-8 MG/5ML PO SUER: 5.0000 mL | Freq: Two times a day (BID) | ORAL | 0 refills | Status: DC | PRN

## 2020-11-28 MED ORDER — PREDNISONE 20 MG PO TABS: 20.0000 mg | ORAL_TABLET | Freq: Every day | ORAL | 0 refills | Status: DC

## 2020-11-28 MED ORDER — LEVOFLOXACIN 500 MG PO TABS: 500.0000 mg | ORAL_TABLET | Freq: Every day | ORAL | 0 refills | Status: AC

## 2020-11-28 NOTE — Progress Notes (Signed)
Rhonda Thompson is a 45 y.o. female with the following history as recorded in [facility]:  Patient Active Problem List   Diagnosis Date Noted   Fatigue 11/06/2020   Vitamin D deficiency 11/06/2020   Vitamin B12 deficiency 11/06/2020   Palpitations 11/02/2020   Sinusitis 07/07/2020   Neck pain 07/07/2020   Adhesive capsulitis of left shoulder 12/29/2019   Episodic mood disorder (HCC) 05/10/2013   Obsessive-compulsive disorder, unspecified 02/01/2013   Panic attacks 02/01/2013   Anxiety 02/01/2013   Lower urinary tract infectious disease 02/01/2013    Current Outpatient Medications  Medication Sig Dispense Refill   acetaminophen (TYLENOL) 500 MG tablet Take 500 mg by mouth every 6 (six) hours as needed.     ALPRAZolam (XANAX) 0.5 MG tablet Take 1 tablet (0.5 mg total) by mouth daily as needed for anxiety. 30 tablet 2   chlorpheniramine-HYDROcodone (TUSSIONEX PENNKINETIC ER) 10-8 MG/5ML SUER Take 5 mLs by mouth every 12 (twelve) hours as needed for cough. 115 mL 0   Cholecalciferol (VITAMIN D3) 1.25 MG (50000 UT) CAPS Take 1 weekly for 12 weeks 12 capsule 0   levofloxacin (LEVAQUIN) 500 MG tablet Take 1 tablet (500 mg total) by mouth daily for 7 days. 7 tablet 0   predniSONE (DELTASONE) 20 MG tablet Take 1 tablet (20 mg total) by mouth daily with breakfast. 5 tablet 0   FLUoxetine (PROZAC) 20 MG tablet Take 1/2 tablet daily for 7 days, then increase to 1 tablet daily thereafter (Patient not taking: Reported on 11/28/2020) 30 tablet 1   No current facility-administered medications for this visit.    Allergies: Caffeine, Iodinated diagnostic agents, Other, Red dye, and Nitrofurantoin  Past Medical History:  Diagnosis Date   Anxiety    has panic attacks   Depression    OCD (obsessive compulsive disorder)    PTSD (post-traumatic stress disorder)     Past Surgical History:  Procedure Laterality Date   CHOLECYSTECTOMY  06/10/13    Family History  Problem Relation Age of Onset    Hypertension Mother    Diabetes Mother    Cancer Mother        ovarian and kidney   Depression Mother     Social History   Tobacco Use   Smoking status: Never   Smokeless tobacco: Never  Substance Use Topics   Alcohol use: No    Alcohol/week: 0.0 standard drinks    Subjective:   Cough x 1 month; has done 2 different e-visits; Treated for strep throat on May 19 with course of Amoxicillin with no improvement; Treated for bronchitis with Doxycyline and Tessalon Perles with some improvement but symptoms returned after finishing the antibiotic.  Did not find Tessalon Perles helpful and does not want to take again; Notes she is just completely worn out from coughing; not sleeping at night due to coughing; No prior history of asthma/ allergies; no new home or work environment.      Objective:  Vitals:   11/28/20 1440  BP: (!) 140/98  Pulse: 88  Temp: 98 F (36.7 C)  TempSrc: Oral  SpO2: 99%  Weight: (!) 301 lb (136.5 kg)  Height: 5\' 11"  (1.803 m)    General: Well developed, well nourished, in no acute distress  Skin : Warm and dry.  Head: Normocephalic and atraumatic  Eyes: Sclera and conjunctiva clear; pupils round and reactive to light; extraocular movements intact  Ears: External normal; canals clear; tympanic membranes normal  Oropharynx: Pink, supple. No suspicious lesions  Neck: Supple  without thyromegaly, adenopathy  Lungs: Respirations unlabored; clear to auscultation bilaterally without wheeze, rales, rhonchi  CV[facility] exam: normal rate and regular rhythm.  Neurologic: Alert and oriented; speech intact; face symmetrical; moves all extremities well; CNII-XII intact without focal deficit   Assessment:  1. Chronic cough   2. Acute sinusitis, recurrence not specified, unspecified location   3. Elevated blood pressure reading     Plan:  Update CXR due to length of time symptoms have been present; trial of Levaquin and Prednisone; Rx for Tussionex to help her sleep at  night; if symptoms persist, to consider pulmonology referral; Patient monitors her blood pressure at home and notes it is always elevated in office and stable at home; she will continue to monitor and follow up if consistently above 140/ 90.  This visit occurred during the SARS-CoV-2 public health emergency.  Safety protocols were in place, including screening questions prior to the visit, additional usage of staff PPE, and extensive cleaning of exam room while observing appropriate contact time as indicated for disinfecting solutions.    No follow-ups on file.  Orders Placed This Encounter  Procedures   DG Chest 2 View    Standing Status:   Future    Number of Occurrences:   1    Standing Expiration Date:   11/28/2021    Order Specific Question:   Reason for Exam (SYMPTOM  OR DIAGNOSIS REQUIRED)    Answer:   chronic cough    Order Specific Question:   Is patient pregnant?    Answer:   No    Order Specific Question:   Preferred imaging location?    Answer:   [facility]    Requested Prescriptions   Signed Prescriptions Disp Refills   levofloxacin (LEVAQUIN) 500 MG tablet 7 tablet 0    Sig: Take 1 tablet (500 mg total) by mouth daily for 7 days.   predniSONE (DELTASONE) 20 MG tablet 5 tablet 0    Sig: Take 1 tablet (20 mg total) by mouth daily with breakfast.   chlorpheniramine-HYDROcodone (TUSSIONEX PENNKINETIC ER) 10-8 MG/5ML SUER 115 mL 0    Sig: Take 5 mLs by mouth every 12 (twelve) hours as needed for cough.

## 2020-12-16 ENCOUNTER — Other Ambulatory Visit: Payer: Self-pay | Admitting: Family Medicine

## 2020-12-25 ENCOUNTER — Telehealth: Payer: BC Managed Care – PPO | Admitting: Family Medicine

## 2020-12-29 ENCOUNTER — Encounter: Payer: Self-pay | Admitting: Family Medicine

## 2020-12-29 DIAGNOSIS — E559 Vitamin D deficiency, unspecified: Secondary | ICD-10-CM

## 2020-12-29 DIAGNOSIS — E538 Deficiency of other specified B group vitamins: Secondary | ICD-10-CM

## 2021-01-01 ENCOUNTER — Other Ambulatory Visit: Payer: Self-pay | Admitting: Family Medicine

## 2021-01-01 DIAGNOSIS — E559 Vitamin D deficiency, unspecified: Secondary | ICD-10-CM

## 2021-01-02 NOTE — Telephone Encounter (Signed)
Lets see what her follow up vit D level looks like and we can hopefully change her to otc vit D.  Refill declined.

## 2021-01-03 ENCOUNTER — Other Ambulatory Visit: Payer: Self-pay

## 2021-01-03 ENCOUNTER — Other Ambulatory Visit (INDEPENDENT_AMBULATORY_CARE_PROVIDER_SITE_OTHER): Payer: BC Managed Care – PPO

## 2021-01-03 DIAGNOSIS — E538 Deficiency of other specified B group vitamins: Secondary | ICD-10-CM | POA: Diagnosis not present

## 2021-01-03 DIAGNOSIS — E559 Vitamin D deficiency, unspecified: Secondary | ICD-10-CM

## 2021-01-03 LAB — VITAMIN D 25 HYDROXY (VIT D DEFICIENCY, FRACTURES): VITD: 24.43 ng/mL — ABNORMAL LOW (ref 30.00–100.00)

## 2021-01-03 LAB — B12 AND FOLATE PANEL
Folate: 22.3 ng/mL (ref >5.9)
Vitamin B-12: 238 pg/mL (ref 211–911)

## 2021-01-17 NOTE — Addendum Note (Signed)
Addended by: Thelma Barge D on: 01/17/2021 04:12 PM   Modules accepted: Orders

## 2021-01-17 NOTE — Addendum Note (Signed)
Addended by: Thelma Barge D on: 01/17/2021 04:11 PM   Modules accepted: Orders

## 2021-01-17 NOTE — Telephone Encounter (Signed)
It has been reordered

## 2021-01-31 ENCOUNTER — Other Ambulatory Visit: Payer: Self-pay

## 2021-01-31 ENCOUNTER — Ambulatory Visit (HOSPITAL_COMMUNITY)
Admission: RE | Admit: 2021-01-31 | Discharge: 2021-01-31 | Disposition: A | Discharge status: Home / Self Care | Payer: BC Managed Care – PPO | Source: Ambulatory Visit | Attending: Family Medicine | Admitting: Family Medicine

## 2021-01-31 DIAGNOSIS — R002 Palpitations: Secondary | ICD-10-CM | POA: Insufficient documentation

## 2021-01-31 LAB — ECHOCARDIOGRAM COMPLETE
Area-P 1/2: 4.06 cm2
S' Lateral: 4 cm

## 2021-01-31 NOTE — Telephone Encounter (Signed)
Printed in put in yellow bin to be sign

## 2021-01-31 NOTE — Progress Notes (Signed)
  Echocardiogram 2D Echocardiogram has been performed.  Augustine Radar 01/31/2021, 10:59 AM

## 2021-02-02 ENCOUNTER — Telehealth: Payer: Self-pay

## 2021-02-02 NOTE — Telephone Encounter (Signed)
Form done and faxed. Also e-mail

## 2021-02-09 ENCOUNTER — Other Ambulatory Visit: Payer: Self-pay | Admitting: Family

## 2021-02-26 ENCOUNTER — Encounter: Payer: Self-pay | Admitting: Adult Health

## 2021-02-26 ENCOUNTER — Telehealth (INDEPENDENT_AMBULATORY_CARE_PROVIDER_SITE_OTHER): Payer: BC Managed Care – PPO | Admitting: Adult Health

## 2021-02-26 DIAGNOSIS — F429 Obsessive-compulsive disorder, unspecified: Secondary | ICD-10-CM

## 2021-02-26 DIAGNOSIS — F410 Panic disorder [episodic paroxysmal anxiety] without agoraphobia: Secondary | ICD-10-CM

## 2021-02-26 DIAGNOSIS — F411 Generalized anxiety disorder: Secondary | ICD-10-CM

## 2021-02-26 DIAGNOSIS — F4310 Post-traumatic stress disorder, unspecified: Secondary | ICD-10-CM | POA: Diagnosis not present

## 2021-02-26 DIAGNOSIS — F331 Major depressive disorder, recurrent, moderate: Secondary | ICD-10-CM

## 2021-02-26 MED ORDER — SERTRALINE HCL 50 MG PO TABS: 50.0000 mg | ORAL_TABLET | Freq: Two times a day (BID) | ORAL | 5 refills | Status: DC

## 2021-02-26 NOTE — Progress Notes (Signed)
Rhonda Thompson [id number] 1976/01/04 45 y.o.  Virtual Visit via Video Note  I connected with pt @ on 02/26/21 at  8:40 AM EDT by a video enabled telemedicine application and verified that I am speaking with the correct person using two identifiers.   I discussed the limitations of evaluation and management by telemedicine and the availability of in person appointments. The patient expressed understanding and agreed to proceed.  I discussed the assessment and treatment plan with the patient. The patient was provided an opportunity to ask questions and all were answered. The patient agreed with the plan and demonstrated an understanding of the instructions.   The patient was advised to call back or seek an in-person evaluation if the symptoms worsen or if the condition fails to improve as anticipated.  I provided 25 minutes of non-face-to-face time during this encounter.  The patient was located at home.  The provider was located at [facility].   Dorothyann Gibbs, NP   Subjective:   Patient ID:  Rhonda Thompson is a 45 y.o. (DOB 17-Apr-1976) female.  Chief Complaint: No chief complaint on file.   HPI Rhonda Thompson presents for follow-up of GAD, MDD, OCD, PTSD, and Panic attacks.  Describes mood today as "ok". Pleasant. Mood symptoms - reports depression, anxiety, and irritability. Reports  decreased panic attacks. Stating "I'm not doing too good". Would like to restart Zoloft. Has seen another provider and trialed on Zoloft and Paxil CR. Reports not tolerating either of the medications and would like to restart the Zoloft. Has stopped the Xanax XR and used Xanax IR occasionally. Has accepted a new position at work - working long hours. Stable interest and motivation. Taking medications as prescribed.  Energy levels stable. Active, does not have a regular exercise routine. . Enjoys some usual interests and activities. Single. Lives with niece and dog. Spending time with family. Appetite  adequate. Weight stable. Sleeping difficulties. Averages 6 hours. Focus and concentration stable. Completing tasks. Managing aspects of household. Work stressful - has changed positions.   Denies SI or HI.  Denies AH or VH.    Review of Systems:  Review of Systems  Musculoskeletal:  Negative for gait problem.  Neurological:  Negative for tremors.  Psychiatric/Behavioral:         Please refer to HPI   Medications: I have reviewed the patient's current medications.  Current Outpatient Medications  Medication Sig Dispense Refill   sertraline (ZOLOFT) 50 MG tablet Take 1 tablet (50 mg total) by mouth 2 (two) times daily. 60 tablet 5   acetaminophen (TYLENOL) 500 MG tablet Take 500 mg by mouth every 6 (six) hours as needed.     ALPRAZolam (XANAX) 0.5 MG tablet Take 1 tablet (0.5 mg total) by mouth daily as needed for anxiety. 30 tablet 2   chlorpheniramine-HYDROcodone (TUSSIONEX PENNKINETIC ER) 10-8 MG/5ML SUER Take 5 mLs by mouth every 12 (twelve) hours as needed for cough. 115 mL 0   Cholecalciferol (VITAMIN D3) 1.25 MG (50000 UT) CAPS Take 1 weekly for 12 weeks 12 capsule 0   predniSONE (DELTASONE) 20 MG tablet Take 1 tablet (20 mg total) by mouth daily with breakfast. 5 tablet 0   No current facility-administered medications for this visit.    Medication Side Effects: None  Allergies:  Allergies  Allergen Reactions   Caffeine Anxiety and Other (See Comments)   Iodinated Diagnostic Agents Swelling    Hives and itching   Other Rash    Bandaids   Red Dye  Other (See Comments)    headache   Nitrofurantoin Other (See Comments)    headache    Past Medical History:  Diagnosis Date   Anxiety    has panic attacks   Depression    OCD (obsessive compulsive disorder)    PTSD (post-traumatic stress disorder)     Family History  Problem Relation Age of Onset   Hypertension Mother    Diabetes Mother    Cancer Mother        ovarian and kidney   Depression Mother      Social History   Socioeconomic History   Marital status: Single    Spouse name: Not on file   Number of children: Not on file   Years of education: Not on file   Highest education level: Not on file  Occupational History   Not on file  Tobacco Use   Smoking status: Never   Smokeless tobacco: Never  Substance and Sexual Activity   Alcohol use: No    Alcohol/week: 0.0 standard drinks   Drug use: Not on file   Sexual activity: Not on file  Other Topics Concern   Not on file  Social History Narrative   Lives with her mom   Lost a child 5-6   Raising her niece since she was born (she is age 69)   Works as a Architectural technologist for [employer]   2 pit bull rescues   Social Determinants of Corporate investment banker Strain: Not on Ship broker Insecurity: Not on file  Transportation Needs: Not on file  Physical Activity: Not on file  Stress: Not on file  Social Connections: Not on file  Intimate Partner Violence: Not on file    Past Medical History, Surgical history, Social history, and Family history were reviewed and updated as appropriate.   Please see review of systems for further details on the patient's review from today.   Objective:   Physical Exam:  There were no vitals taken for this visit.  Physical Exam Constitutional:      General: She is not in acute distress. Musculoskeletal:        General: No deformity.  Neurological:     Mental Status: She is alert and oriented to person, place, and time.     Coordination: Coordination normal.  Psychiatric:        Attention and Perception: Attention and perception normal. She does not perceive auditory or visual hallucinations.        Mood and Affect: Mood normal. Mood is not anxious or depressed. Affect is not labile, blunt, angry or inappropriate.        Speech: Speech normal.        Behavior: Behavior normal.        Thought Content: Thought content normal. Thought content is not paranoid or delusional.  Thought content does not include homicidal or suicidal ideation. Thought content does not include homicidal or suicidal plan.        Cognition and Memory: Cognition and memory normal.        Judgment: Judgment normal.     Comments: Insight intact    Lab Review:     Component Value Date/Time   NA 140 09/28/2020 0846   K 4.4 09/28/2020 0846   CL 104 09/28/2020 0846   CO2 28 09/28/2020 0846   GLUCOSE 86 09/28/2020 0846   BUN 10 09/28/2020 0846   CREATININE 0.84 09/28/2020 0846   CALCIUM 9.1 09/28/2020 0846  PROT 6.8 09/28/2020 0846   ALBUMIN 4.0 09/28/2020 0846   AST 16 09/28/2020 0846   ALT 16 09/28/2020 0846   ALKPHOS 52 09/28/2020 0846   BILITOT 0.5 09/28/2020 0846       Component Value Date/Time   WBC 6.9 09/28/2020 0846   RBC 4.77 09/28/2020 0846   HGB 12.9 09/28/2020 0846   HCT 39.2 09/28/2020 0846   PLT 275.0 09/28/2020 0846   MCV 82.3 09/28/2020 0846   MCHC 32.9 09/28/2020 0846   RDW 14.4 09/28/2020 0846   LYMPHSABS 1.7 06/20/2017 0852   MONOABS 0.5 06/20/2017 0852   EOSABS 0.2 06/20/2017 0852   BASOSABS 0.1 06/20/2017 0852    No results found for: POCLITH, LITHIUM   No results found for: PHENYTOIN, PHENOBARB, VALPROATE, CBMZ   .res Assessment: Plan:     Plan:  PDMP reviewed  1. Restart Zoloft 100mg  daily - sent in 50mg  for titration 2. Xanax XR 0.5mg  twice daily - none needed 3. Xanax 0.5mg  daily prn - none needed  RTC 6 months  Patient advised to contact office with any questions, adverse effects, or acute worsening in signs and symptoms.  Discussed potential benefits, risk, and side effects of benzodiazepines to include potential risk of tolerance and dependence, as well as possible drowsiness.  Advised patient not to drive if experiencing drowsiness and to take lowest possible effective dose to minimize risk of dependence and tolerance.   Diagnoses and all orders for this visit:  Generalized anxiety disorder -     sertraline (ZOLOFT) 50 MG  tablet; Take 1 tablet (50 mg total) by mouth 2 (two) times daily.  Panic attacks -     sertraline (ZOLOFT) 50 MG tablet; Take 1 tablet (50 mg total) by mouth 2 (two) times daily.  PTSD (post-traumatic stress disorder) -     sertraline (ZOLOFT) 50 MG tablet; Take 1 tablet (50 mg total) by mouth 2 (two) times daily.  Major depressive disorder, recurrent episode, moderate (HCC) -     sertraline (ZOLOFT) 50 MG tablet; Take 1 tablet (50 mg total) by mouth 2 (two) times daily.  Obsessive-compulsive disorder, unspecified type -     sertraline (ZOLOFT) 50 MG tablet; Take 1 tablet (50 mg total) by mouth 2 (two) times daily.    Please see After Visit Summary for patient specific instructions.  No future appointments.  No orders of the defined types were placed in this encounter.     -------------------------------

## 2021-05-17 ENCOUNTER — Telehealth: Payer: BC Managed Care – PPO | Admitting: Physician Assistant

## 2021-05-17 DIAGNOSIS — R300 Dysuria: Secondary | ICD-10-CM | POA: Diagnosis not present

## 2021-05-17 MED ORDER — CEFDINIR 300 MG PO CAPS: 300.0000 mg | ORAL_CAPSULE | Freq: Two times a day (BID) | ORAL | 0 refills | Status: DC

## 2021-05-17 NOTE — Progress Notes (Signed)

## 2021-05-17 NOTE — Progress Notes (Signed)
I have spent 5 minutes in review of e-visit questionnaire, review and updating patient chart, medical decision making and response to patient.   Xiomar Crompton Cody Elize Pinon, PA-C    

## 2021-05-30 ENCOUNTER — Ambulatory Visit: Payer: BC Managed Care – PPO | Admitting: Family

## 2021-05-30 ENCOUNTER — Encounter: Payer: Self-pay | Admitting: Family

## 2021-05-30 VITALS — BP 118/73

## 2021-05-30 DIAGNOSIS — I10 Essential (primary) hypertension: Secondary | ICD-10-CM | POA: Diagnosis not present

## 2021-05-30 DIAGNOSIS — N3001 Acute cystitis with hematuria: Secondary | ICD-10-CM | POA: Insufficient documentation

## 2021-05-30 DIAGNOSIS — R3129 Other microscopic hematuria: Secondary | ICD-10-CM

## 2021-05-30 LAB — POC URINALSYSI DIPSTICK (AUTOMATED)
Bilirubin, UA: NEGATIVE
Glucose, UA: NEGATIVE
Ketones, UA: NEGATIVE
Leukocytes, UA: NEGATIVE
Nitrite, UA: NEGATIVE
Protein, UA: NEGATIVE
Spec Grav, UA: 1.025 (ref 1.010–1.025)
Urobilinogen, UA: 0.2 E.U./dL
pH, UA: 6 (ref 5.0–8.0)

## 2021-05-30 MED ORDER — CIPROFLOXACIN HCL 500 MG PO TABS: 500.0000 mg | ORAL_TABLET | Freq: Two times a day (BID) | ORAL | 0 refills | Status: AC

## 2021-05-30 NOTE — Patient Instructions (Signed)
Begin Cipro for UTI.   Call if symptoms worsen or if they do not improve.

## 2021-05-30 NOTE — Assessment & Plan Note (Signed)
Declines bp check in office. Recorded her home reading from this AM which was WNL.

## 2021-05-30 NOTE — Progress Notes (Signed)
Subjective:   Patient ID: Rhonda Thompson, female    DOB: Jun 02, 1976, 45 y.o.   MRN: [id number]  Chief Complaint  Patient presents with   Urinary Tract Infection    Complains of UTI     Urinary Tract Infection  Associated symptoms include chills and urgency. Pertinent negatives include no hematuria.  Patient is in today for a office visit.  UTI- She complains of a UTI since 05/13/2021. She is experiencing urgency that worsen at night and keeps her from sleeping. She had a telehealth visit and was given 300 mg cefdinir daily PO to manage her symptoms and found no change in her symptoms. She reports recently developing chills. She denies having any blood in the urine. Her urine color is dark due to not keeping up with her water intake. She notes at work she has difficulty taking off her n95 mask to drink water.  Blood pressure- Her blood pressure is elevated during this visit. She measures her blood pressure regularly at home and reports they are normal. She reports her blood pressure measured 118/73 this morning at home. She gets nervous every time she goes to the doctors office.  BP Readings from Last 3 Encounters:  05/30/21 118/73  11/28/20 (!) 140/98  09/28/20 128/90   Pulse Readings from Last 3 Encounters:  11/28/20 88  09/28/20 74  07/06/20 88    Health Maintenance Due  Topic Date Due   COVID-19 Vaccine (1) Never done   PAP SMEAR-Modifier  06/10/2016   INFLUENZA VACCINE  01/08/2021    Past Medical History:  Diagnosis Date   Anxiety    has panic attacks   Depression    OCD (obsessive compulsive disorder)    PTSD (post-traumatic stress disorder)     Past Surgical History:  Procedure Laterality Date   CHOLECYSTECTOMY  06/10/13    Family History  Problem Relation Age of Onset   Hypertension Mother    Diabetes Mother    Cancer Mother        ovarian and kidney   Depression Mother     Social History   Socioeconomic History   Marital status: Single    Spouse name:  Not on file   Number of children: Not on file   Years of education: Not on file   Highest education level: Not on file  Occupational History   Not on file  Tobacco Use   Smoking status: Never   Smokeless tobacco: Never  Substance and Sexual Activity   Alcohol use: No    Alcohol/week: 0.0 standard drinks   Drug use: Not on file   Sexual activity: Not on file  Other Topics Concern   Not on file  Social History Narrative   Lives with her mom   Lost a child 5-6   Raising her niece since she was born (she is age 12)   Works as a Medical sales representative for [employer]   2 pit bull rescues   Social Determinants of Radio broadcast assistant Strain: Not on Art therapist Insecurity: Not on file  Transportation Needs: Not on file  Physical Activity: Not on file  Stress: Not on file  Social Connections: Not on file  Intimate Partner Violence: Not on file    Outpatient Medications Prior to Visit  Medication Sig Dispense Refill   acetaminophen (TYLENOL) 500 MG tablet Take 500 mg by mouth every 6 (six) hours as needed.     ALPRAZolam (XANAX) 0.5 MG tablet Take 1 tablet (  0.5 mg total) by mouth daily as needed for anxiety. 30 tablet 2   cefdinir (OMNICEF) 300 MG capsule Take 1 capsule (300 mg total) by mouth 2 (two) times daily. 14 capsule 0   sertraline (ZOLOFT) 50 MG tablet Take 1 tablet (50 mg total) by mouth 2 (two) times daily. 60 tablet 5   No facility-administered medications prior to visit.    Allergies  Allergen Reactions   Caffeine Anxiety and Other (See Comments)   Iodinated Diagnostic Agents Swelling    Hives and itching   Other Rash    Bandaids   Red Dye Other (See Comments)    headache   Nitrofurantoin Other (See Comments)    headache    Review of Systems  Constitutional:  Positive for chills.  Genitourinary:  Positive for urgency. Negative for hematuria.       (+)dark color urine      Objective:    Physical Exam Constitutional:      General: She is not in  acute distress.    Appearance: Normal appearance. She is not ill-appearing.  HENT:     Head: Normocephalic and atraumatic.     Right Ear: External ear normal.     Left Ear: External ear normal.  Eyes:     Extraocular Movements: Extraocular movements intact.     Pupils: Pupils are equal, round, and reactive to light.  Cardiovascular:     Rate and Rhythm: Normal rate and regular rhythm.     Heart sounds: Normal heart sounds. No murmur heard.   No gallop.  Pulmonary:     Effort: Pulmonary effort is normal. No respiratory distress.     Breath sounds: Normal breath sounds. No wheezing or rales.  Abdominal:     General: Bowel sounds are normal.     Palpations: Abdomen is soft.     Tenderness: There is no abdominal tenderness. There is no right CVA tenderness or left CVA tenderness.  Skin:    General: Skin is warm and dry.  Neurological:     Mental Status: She is alert and oriented to person, place, and time.  Psychiatric:        Behavior: Behavior normal.        Judgment: Judgment normal.    BP 118/73  Wt Readings from Last 3 Encounters:  11/28/20 (!) 301 lb (136.5 kg)  09/28/20 (!) 306 lb (138.8 kg)  05/22/20 296 lb (134.3 kg)       Assessment & Plan:   Problem List Items Addressed This Visit       Unprioritized   White coat syndrome with hypertension    Declines bp check in office. Recorded her home reading from this AM which was WNL.       Acute cystitis with hematuria - Primary    She also has her menstrual cycle today which is likely contributing to the hematuria. No in improvement previously with cefdinir. She is allergic to macrobid. Will send urine for culture and begin rx of cipro.       Other Visit Diagnoses     Microhematuria       Relevant Orders   POCT Urinalysis Dipstick (Automated) (Completed)   Urine Culture        Meds ordered this encounter  Medications   ciprofloxacin (CIPRO) 500 MG tablet    Sig: Take 1 tablet (500 mg total) by mouth 2  (two) times daily for 3 days.    Dispense:  6 tablet    Refill:  0    Order Specific Question:   Supervising Provider    Answer:   Danise Edge A [[id number]]    I, Sandford Craze NP, personally preformed the services described in this documentation.  All medical record entries made by the scribe were at my direction and in my presence.  I have reviewed the chart and discharge instructions (if applicable) and agree that the record reflects my personal performance and is accurate and complete. 05/30/2021   I,Shehryar Baig,acting as a Neurosurgeon for Lemont Fillers, NP.,have documented all relevant documentation on the behalf of Lemont Fillers, NP,as directed by  Lemont Fillers, NP while in the presence of Lemont Fillers, NP.   Lemont Fillers, NP

## 2021-05-30 NOTE — Assessment & Plan Note (Signed)
She also has her menstrual cycle today which is likely contributing to the hematuria. No in improvement previously with cefdinir. She is allergic to macrobid. Will send urine for culture and begin rx of cipro.

## 2021-06-01 ENCOUNTER — Telehealth: Payer: Self-pay | Admitting: Family

## 2021-06-01 DIAGNOSIS — R319 Hematuria, unspecified: Secondary | ICD-10-CM

## 2021-06-01 LAB — URINE CULTURE
MICRO NUMBER:: 12785585
SPECIMEN QUALITY:: ADEQUATE

## 2021-06-01 NOTE — Telephone Encounter (Signed)
See mychart.  

## 2021-09-25 ENCOUNTER — Encounter: Payer: Self-pay | Admitting: Family Medicine

## 2021-09-25 ENCOUNTER — Ambulatory Visit: Payer: BC Managed Care – PPO | Admitting: Family Medicine

## 2021-09-25 ENCOUNTER — Ambulatory Visit: Payer: Self-pay

## 2021-09-25 VITALS — BP 121/78 | Ht 71.0 in | Wt 301.0 lb

## 2021-09-25 DIAGNOSIS — S83241A Other tear of medial meniscus, current injury, right knee, initial encounter: Secondary | ICD-10-CM | POA: Insufficient documentation

## 2021-09-25 DIAGNOSIS — M25561 Pain in right knee: Secondary | ICD-10-CM

## 2021-09-25 NOTE — Assessment & Plan Note (Signed)
Acute on chronic in nature.  Had an initial injury in November.  She has done medications as well as relative rest.  She is still having limited range of motion.  She has performed greater than 6 weeks of physician directed home exercise therapy.  Previous x-rays have been unrevealing.  Having instability on exam today. ?-Counseled on home exercise therapy and supportive care. ?-Aspiration. ?-MRI of the right knee to evaluate for internal derangement and for presurgical planning. ?-Provided work note.  ?

## 2021-09-25 NOTE — Patient Instructions (Signed)
Nice to meet you ?Please try the brace  ?Please just work on range of motion movements  ?We'll get the MRI at [facility]   ?Please send me a message in MyChart with any questions or updates.  ?We'll setup a virtual visit once the MRI is resulted.  ? ?--Dr. Jordan Likes ? ?

## 2021-09-25 NOTE — Progress Notes (Signed)
?  Rhonda Thompson - 46 y.o. female MRN [id number]  Date of birth: 1975/12/02 ? ?SUBJECTIVE:  Including CC & ROS.  ?No chief complaint on file. ? ? ?Rhonda Thompson is a 45 y.o. female that is presenting with acute on chronic knee pain.  She had initial injury in November.  Has been doing relative rest and physician directed home exercise therapy since that time.  Still having limited range of motion.  Having pain with weightbearing. ? ?Review of the note from 05/01/2021 shows an x-ray showed no acute changes. ?Review of the note from 2/28 shows she is provided a Medrol Dosepak. ? ? ?Review of Systems ?See HPI  ? ?HISTORY: Past Medical, Surgical, Social, and Family History Reviewed & Updated per EMR.   ?Pertinent Historical Findings include: ? ?Past Medical History:  ?Diagnosis Date  ? Anxiety   ? has panic attacks  ? Depression   ? OCD (obsessive compulsive disorder)   ? PTSD (post-traumatic stress disorder)   ? ? ?Past Surgical History:  ?Procedure Laterality Date  ? CHOLECYSTECTOMY  06/10/13  ? ? ? ?PHYSICAL EXAM:  ?VS: BP 121/78   Ht 5\' 11"  (1.803 m)   Wt (!) 301 lb (136.5 kg)   BMI 41.98 kg/m?  ?Physical Exam ?Gen: NAD, alert, cooperative with exam, well-appearing ?MSK:  ?Right knee: ? mild to moderate effusion. ?Limited flexion and extension. ?Pain with valgus and varus stress testing. ?Positive Lachman's test ?Neurovascularly intact   ? ? ?Aspiration/Injection Procedure Note ?Jordan Likes ?1975/12/29 ? ?Procedure: Aspiration ?Indications: Right knee pain ? ?Procedure Details ?Consent: Risks of procedure as well as the alternatives and risks of each were explained to the (patient/caregiver).  Consent for procedure obtained. ?Time Out: Verified patient identification, verified procedure, site/side was marked, verified correct patient position, special equipment/implants available, medications/allergies/relevent history reviewed, required imaging and test results available.  Performed.  The area was cleaned with  iodine and alcohol swabs.   ? ?The right knee superior lateral suprapatellar pouch was injected using 3 cc's of 1% lidocaine with a 25 1 1/2" needle.  An 18-gauge 1-1/2 inch needle was used to achieve aspiration.  Ultrasound was used. Images were obtained in long views showing the injection.   ? ?Amount of Fluid Aspirated:  61mL ?Character of Fluid: clear and straw colored ?Fluid was sent for: n/a ? ?A sterile dressing was applied. ? ?Patient did tolerate procedure well. ? ? ? ? ?ASSESSMENT & PLAN:  ? ?Acute medial meniscus tear of right knee ?Acute on chronic in nature.  Had an initial injury in November.  She has done medications as well as relative rest.  She is still having limited range of motion.  She has performed greater than 6 weeks of physician directed home exercise therapy.  Previous x-rays have been unrevealing.  Having instability on exam today. ?-Counseled on home exercise therapy and supportive care. ?-Aspiration. ?-MRI of the right knee to evaluate for internal derangement and for presurgical planning. ?-Provided work note.  ? ? ? ? ?

## 2021-10-13 ENCOUNTER — Ambulatory Visit (INDEPENDENT_AMBULATORY_CARE_PROVIDER_SITE_OTHER): Payer: BC Managed Care – PPO

## 2021-10-13 DIAGNOSIS — S83241A Other tear of medial meniscus, current injury, right knee, initial encounter: Secondary | ICD-10-CM

## 2021-10-13 DIAGNOSIS — M25561 Pain in right knee: Secondary | ICD-10-CM | POA: Diagnosis not present

## 2021-10-13 DIAGNOSIS — G8929 Other chronic pain: Secondary | ICD-10-CM

## 2021-10-16 ENCOUNTER — Telehealth (INDEPENDENT_AMBULATORY_CARE_PROVIDER_SITE_OTHER): Payer: BC Managed Care – PPO | Admitting: Family Medicine

## 2021-10-16 DIAGNOSIS — S83241D Other tear of medial meniscus, current injury, right knee, subsequent encounter: Secondary | ICD-10-CM

## 2021-10-16 NOTE — Progress Notes (Signed)
Virtual Visit via Video Note ? ?I connected with Rhonda Thompson on 10/16/21 at  8:00 AM EDT by a video enabled telemedicine application and verified that I am speaking with the correct person using two identifiers. ? ?Location: ?Patient: home ?Provider: office ?  ?I discussed the limitations of evaluation and management by telemedicine and the availability of in person appointments. The patient expressed understanding and agreed to proceed. ? ?History of Present Illness: ? ?Rhonda Thompson is a 46 year old female that is following up after the MRI of her right knee.  She continues to have mechanical issues.  The MRI was demonstrating a radial tear of the posterior horn of the medial meniscus adjacent to the meniscal root. ?  ?Observations/Objective: ? ? ?Assessment and Plan: ? ?Medial meniscal tear of the right knee: ?Acute on chronic in nature.  MRI was demonstrating a meniscal tear with extrusion near the meniscal root posteriorly.  This could be associated with her mechanical symptoms on exam today. ?-Counseled on home exercise therapy and supportive care. ?-Referral to orthopedics. ?-Could consider PRP or Zilretta injection. ? ?Follow Up Instructions: ? ?  ?I discussed the assessment and treatment plan with the patient. The patient was provided an opportunity to ask questions and all were answered. The patient agreed with the plan and demonstrated an understanding of the instructions. ?  ?The patient was advised to call back or seek an in-person evaluation if the symptoms worsen or if the condition fails to improve as anticipated. ? ? ? ?Clare Gandy, MD ? ? ?

## 2021-10-16 NOTE — Assessment & Plan Note (Signed)
Acute on chronic in nature.  MRI was demonstrating a meniscal tear with extrusion near the meniscal root posteriorly.  This could be associated with her mechanical symptoms on exam today. ?-Counseled on home exercise therapy and supportive care. ?-Referral to orthopedics. ?-Could consider PRP or Zilretta injection. ? ?

## 2021-10-23 ENCOUNTER — Telehealth: Payer: BC Managed Care – PPO | Admitting: Physician Assistant

## 2021-10-23 DIAGNOSIS — R109 Unspecified abdominal pain: Secondary | ICD-10-CM

## 2021-10-23 DIAGNOSIS — R509 Fever, unspecified: Secondary | ICD-10-CM

## 2021-10-23 DIAGNOSIS — R300 Dysuria: Secondary | ICD-10-CM

## 2021-10-23 DIAGNOSIS — R197 Diarrhea, unspecified: Secondary | ICD-10-CM

## 2021-10-23 NOTE — Progress Notes (Signed)
Based on what you shared with me, I feel your condition warrants further evaluation and I recommend that you be seen in a face to face visit. ? ?Giving fever, belly pain and diarrhea along with other UTI symptoms, it is recommended you be evaluated in person so a urine culture can be obtained to make sure proper treatment course is given and nothing else going on. Please reach out to your PCP office to see if they are able to get you in today or if they can do a video visit with you an dhave you drop off urine sample for testing.  ?  ?NOTE: There will be NO CHARGE for this eVisit ?  ?If you are having a true medical emergency please call 911.   ?  ? For an urgent face to face visit, [facility] has six urgent care centers for your convenience:  ?  ? [facility] ?Get Driving Directions ?[phone] ?[address] ?[place], [place] [address] ?  ? [place]) ?Get Driving Directions ?[phone] ?[address] ?[place], [place] [address] ? ?[facility] [place] [facility]) ?Get Driving Directions ?[place]  [address] ? ?[facility] ?Get Driving Directions ?[phone] ?[address], [address] ?[place], [place] [address] ?  ?[facility] [place] ?Get Driving Directions  ?[phone] ?[address] ?[address] ?[place], [place] [address] ?  ?[facility] ?Get Driving Directions ?[phone] ?[address] ?[place], [place] [address] ? ?Your MyChart E-visit questionnaire answers were reviewed by a board certified advanced clinical practitioner to complete your personal care plan based on your specific symptoms.  Thank you for using e-Visits. ?  ? ?

## 2021-10-31 DIAGNOSIS — Z131 Encounter for screening for diabetes mellitus: Secondary | ICD-10-CM | POA: Diagnosis not present

## 2021-10-31 DIAGNOSIS — E538 Deficiency of other specified B group vitamins: Secondary | ICD-10-CM | POA: Diagnosis not present

## 2021-10-31 DIAGNOSIS — Z13220 Encounter for screening for lipoid disorders: Secondary | ICD-10-CM | POA: Diagnosis not present

## 2021-10-31 DIAGNOSIS — E559 Vitamin D deficiency, unspecified: Secondary | ICD-10-CM | POA: Diagnosis not present

## 2021-11-13 DIAGNOSIS — Z713 Dietary counseling and surveillance: Secondary | ICD-10-CM | POA: Diagnosis not present

## 2021-11-13 DIAGNOSIS — Z6841 Body Mass Index (BMI) 40.0 and over, adult: Secondary | ICD-10-CM | POA: Diagnosis not present

## 2021-11-13 DIAGNOSIS — E7800 Pure hypercholesterolemia, unspecified: Secondary | ICD-10-CM | POA: Diagnosis not present

## 2021-12-12 ENCOUNTER — Encounter: Payer: Self-pay | Admitting: Family Medicine

## 2021-12-20 DIAGNOSIS — M1711 Unilateral primary osteoarthritis, right knee: Secondary | ICD-10-CM | POA: Diagnosis not present

## 2022-01-02 DIAGNOSIS — F429 Obsessive-compulsive disorder, unspecified: Secondary | ICD-10-CM | POA: Diagnosis not present

## 2022-01-02 DIAGNOSIS — F4310 Post-traumatic stress disorder, unspecified: Secondary | ICD-10-CM | POA: Diagnosis not present

## 2022-01-21 DIAGNOSIS — R030 Elevated blood-pressure reading, without diagnosis of hypertension: Secondary | ICD-10-CM | POA: Diagnosis not present

## 2022-01-21 DIAGNOSIS — Z713 Dietary counseling and surveillance: Secondary | ICD-10-CM | POA: Diagnosis not present

## 2022-01-21 DIAGNOSIS — Z6841 Body Mass Index (BMI) 40.0 and over, adult: Secondary | ICD-10-CM | POA: Diagnosis not present

## 2022-01-21 DIAGNOSIS — E7800 Pure hypercholesterolemia, unspecified: Secondary | ICD-10-CM | POA: Diagnosis not present

## 2022-02-19 DIAGNOSIS — Z6841 Body Mass Index (BMI) 40.0 and over, adult: Secondary | ICD-10-CM | POA: Diagnosis not present

## 2022-02-19 DIAGNOSIS — E7800 Pure hypercholesterolemia, unspecified: Secondary | ICD-10-CM | POA: Diagnosis not present

## 2022-02-19 DIAGNOSIS — Z7182 Exercise counseling: Secondary | ICD-10-CM | POA: Diagnosis not present

## 2022-02-19 DIAGNOSIS — Z713 Dietary counseling and surveillance: Secondary | ICD-10-CM | POA: Diagnosis not present

## 2022-02-19 DIAGNOSIS — F439 Reaction to severe stress, unspecified: Secondary | ICD-10-CM | POA: Diagnosis not present

## 2022-05-19 IMAGING — DX DG CHEST 2V
2 series · 2 of 2 positions shown · non-contrast
Comparison: None.

CLINICAL DATA: 45-year-old female with a history of chronic cough

EXAM:
CHEST - 2 VIEW

[chest pa]
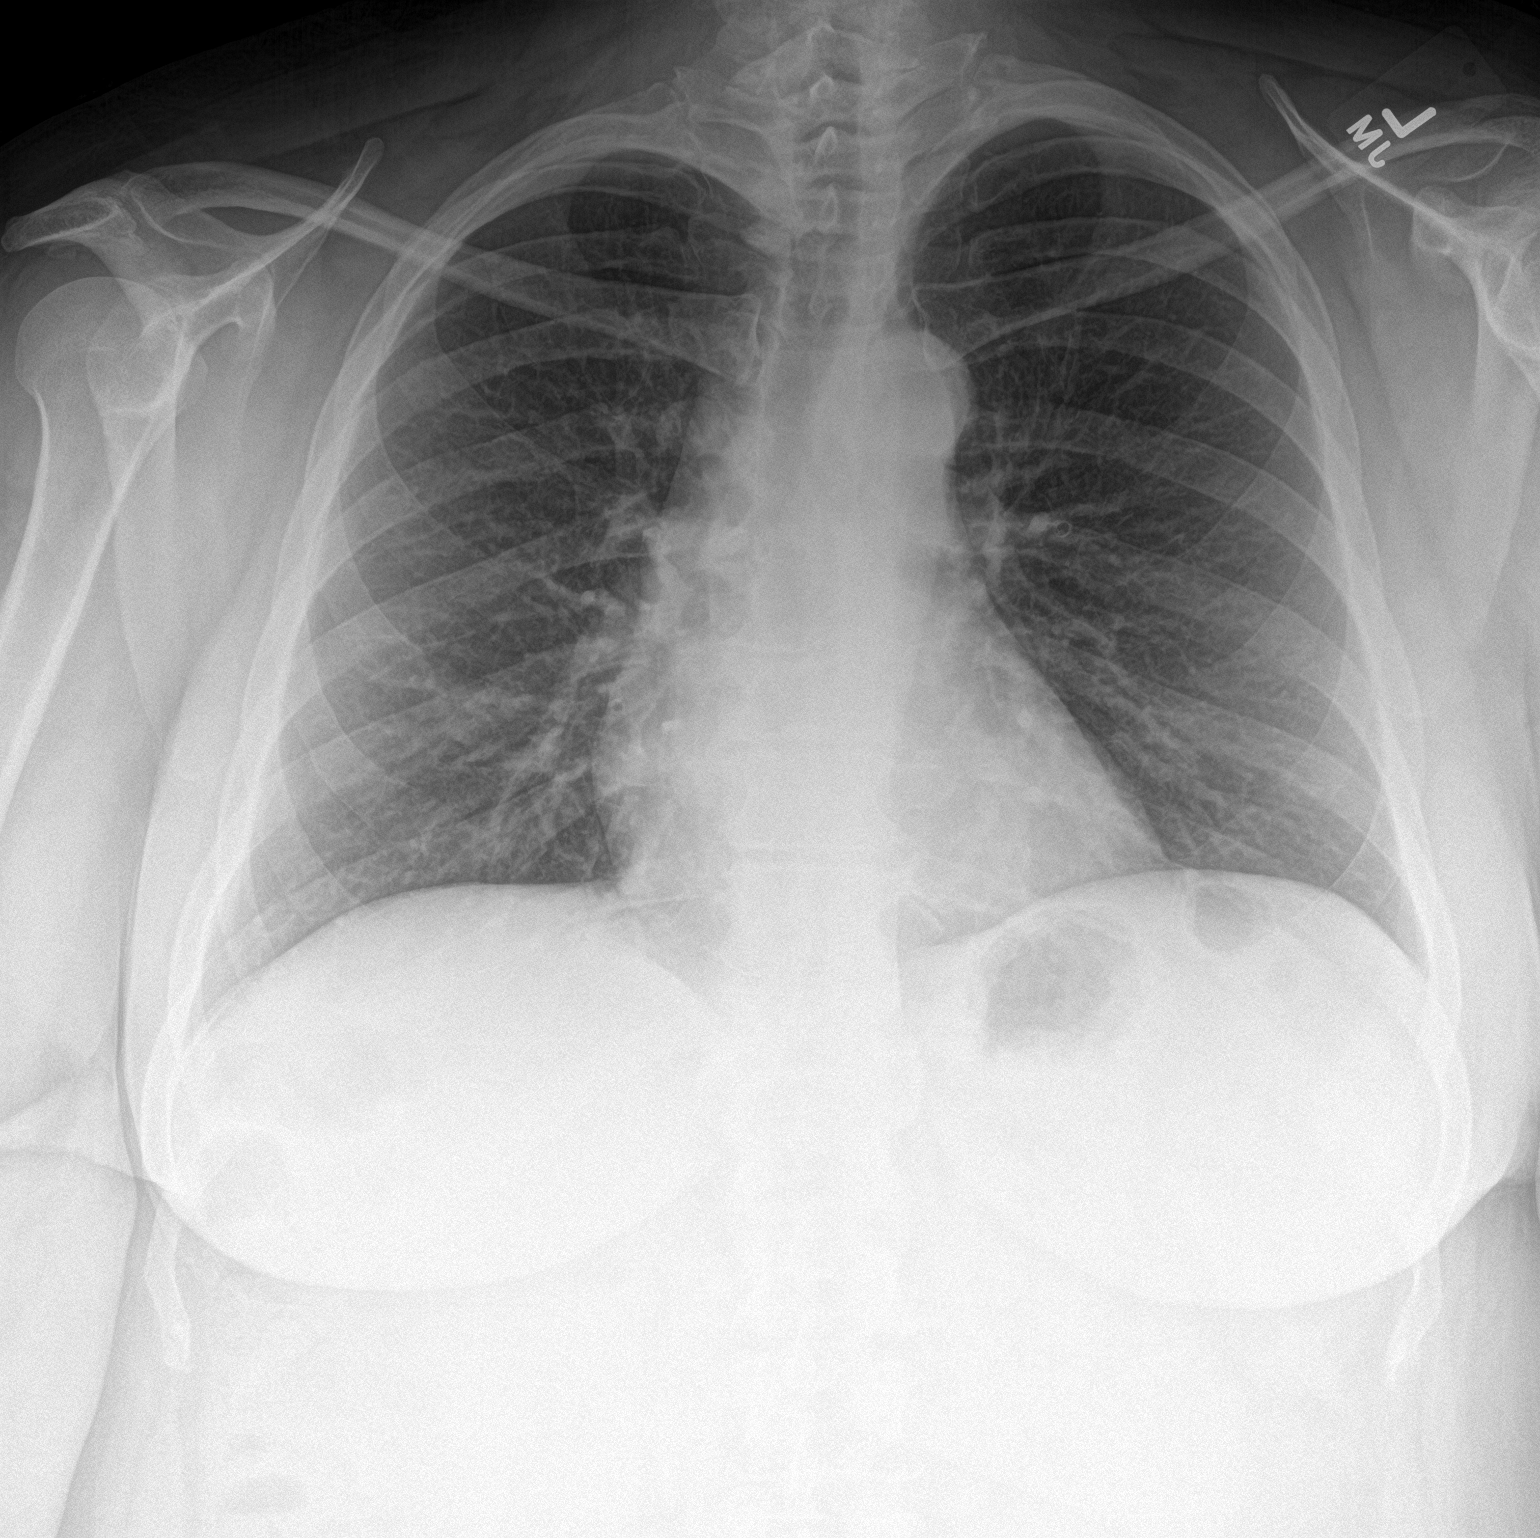

[chest lat]
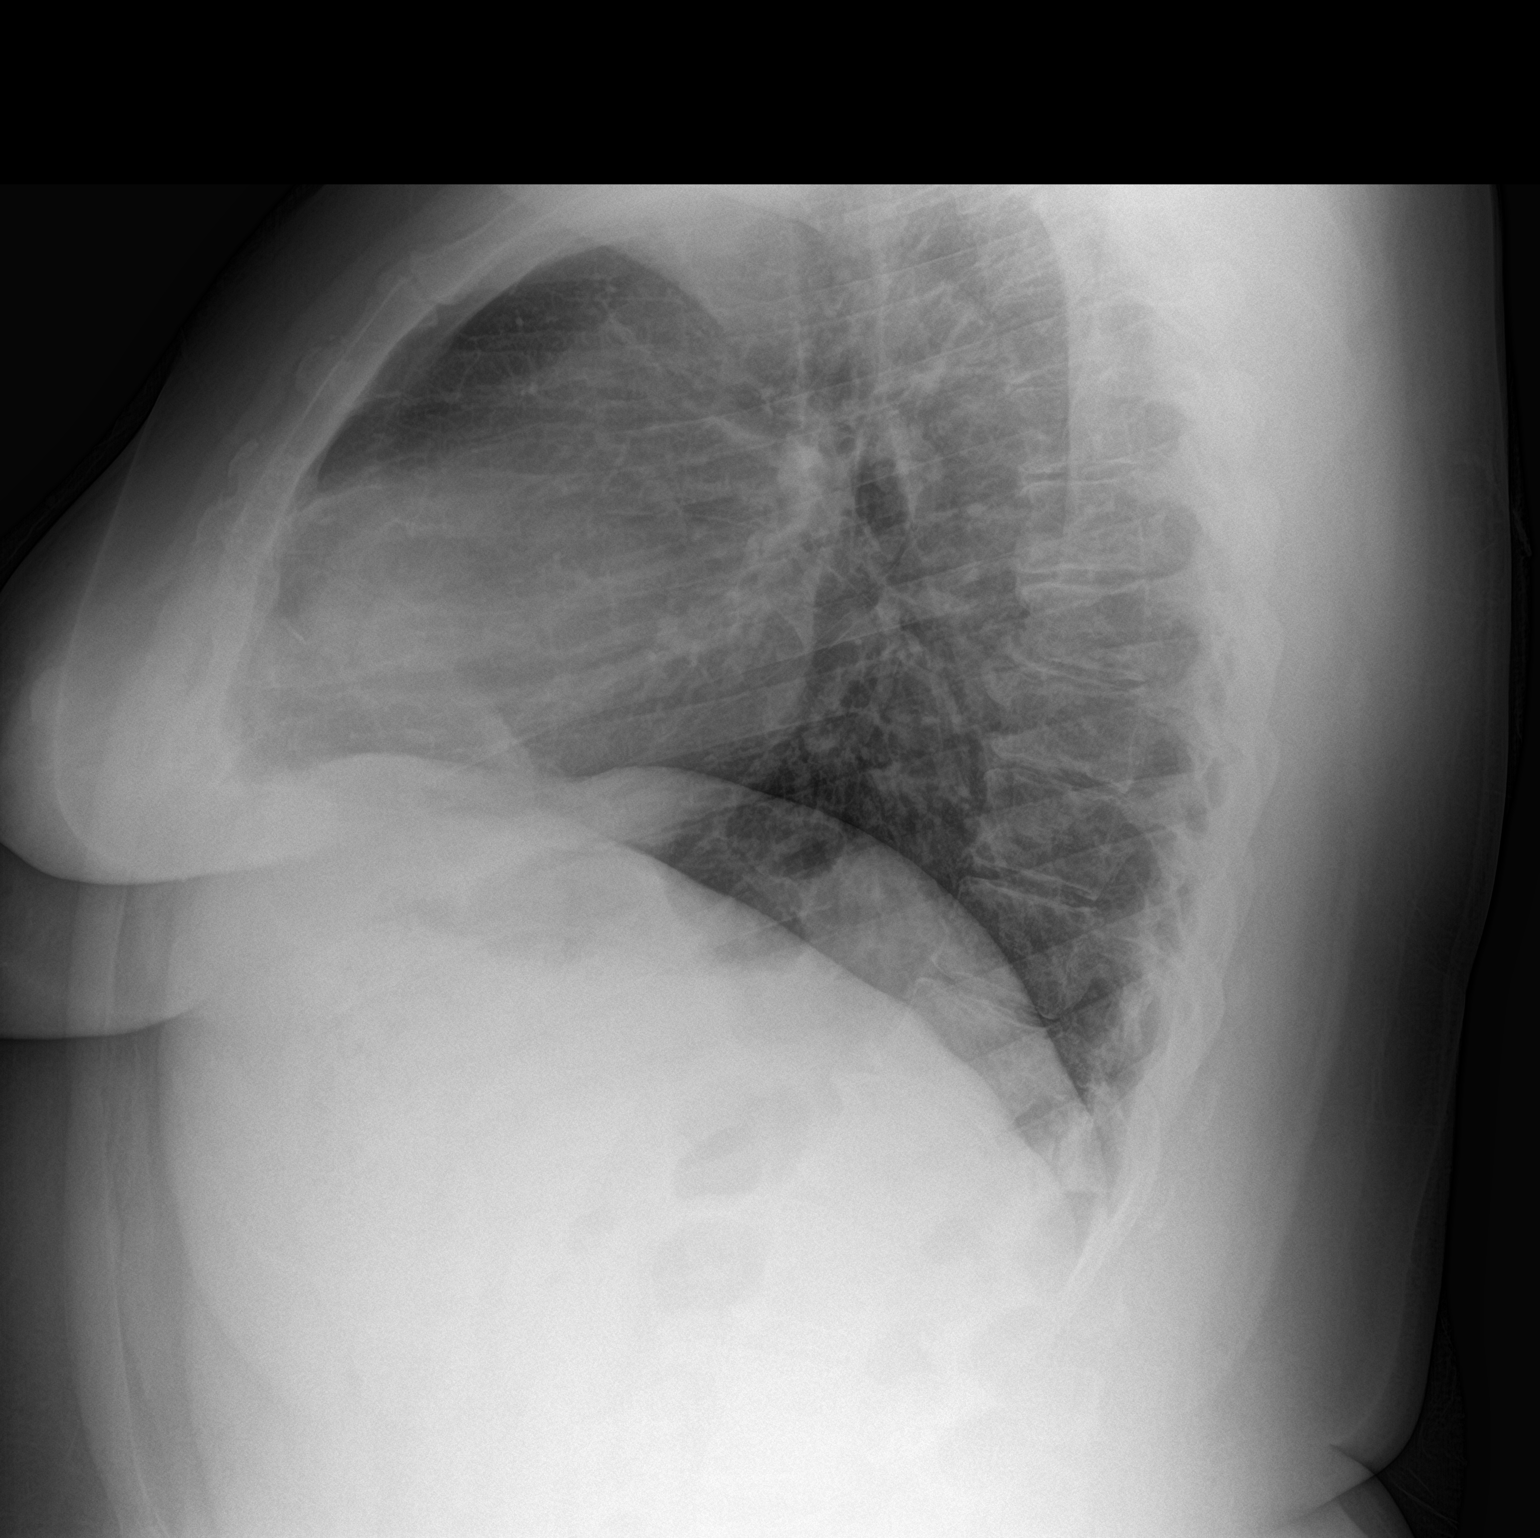

[2 of 2 positions shown; findings below may reference images not displayed]

FINDINGS: Cardiomediastinal silhouette within normal limits in size and
contour. No evidence of central vascular congestion. No interlobular
septal thickening. No pneumothorax or pleural effusion. No confluent
airspace disease.

No acute displaced fracture

Degenerative changes of the thoracic spine
IMPRESSION: No active cardiopulmonary disease.

## 2022-09-23 ENCOUNTER — Encounter: Payer: Self-pay | Admitting: *Deleted
